# Patient Record
Sex: Female | Born: 1970 | Race: Black or African American | Hispanic: No | Marital: Married | Smoking: Current every day smoker
Health system: Southern US, Community
[De-identification: ages and names within clinical notes are randomized; demographics above are authoritative.]

## PROBLEM LIST (undated history)

## (undated) HISTORY — PX: HERNIA REPAIR: SHX51

---

## 1999-03-23 ENCOUNTER — Emergency Department (HOSPITAL_COMMUNITY): Admission: EM | Admit: 1999-03-23 | Discharge: 1999-03-23 | Payer: Self-pay | Admitting: Emergency Medicine

## 2001-08-03 ENCOUNTER — Encounter: Payer: Self-pay | Admitting: *Deleted

## 2001-08-03 ENCOUNTER — Emergency Department (HOSPITAL_COMMUNITY): Admission: EM | Admit: 2001-08-03 | Discharge: 2001-08-03 | Payer: Self-pay | Admitting: *Deleted

## 2002-04-06 ENCOUNTER — Emergency Department (HOSPITAL_COMMUNITY): Admission: EM | Admit: 2002-04-06 | Discharge: 2002-04-06 | Payer: Self-pay

## 2003-09-29 ENCOUNTER — Encounter: Payer: Self-pay | Admitting: Emergency Medicine

## 2003-09-29 ENCOUNTER — Emergency Department (HOSPITAL_COMMUNITY): Admission: EM | Admit: 2003-09-29 | Discharge: 2003-09-29 | Payer: Self-pay | Admitting: Emergency Medicine

## 2004-03-20 ENCOUNTER — Emergency Department (HOSPITAL_COMMUNITY): Admission: EM | Admit: 2004-03-20 | Discharge: 2004-03-20 | Payer: Self-pay

## 2005-05-07 ENCOUNTER — Encounter: Payer: Self-pay | Admitting: Emergency Medicine

## 2005-05-08 ENCOUNTER — Inpatient Hospital Stay (HOSPITAL_COMMUNITY): Admission: EM | Admit: 2005-05-08 | Discharge: 2005-05-10 | Payer: Self-pay | Admitting: Neurology

## 2011-09-22 ENCOUNTER — Inpatient Hospital Stay (HOSPITAL_COMMUNITY)
Admission: EM | Admit: 2011-09-22 | Discharge: 2011-09-29 | DRG: 392 | Disposition: A | Discharge status: Home / Self Care | Payer: Self-pay | Attending: Internal Medicine | Admitting: Internal Medicine

## 2011-09-22 ENCOUNTER — Emergency Department (HOSPITAL_COMMUNITY): Payer: Self-pay

## 2011-09-22 DIAGNOSIS — D649 Anemia, unspecified: Secondary | ICD-10-CM | POA: Diagnosis present

## 2011-09-22 DIAGNOSIS — R197 Diarrhea, unspecified: Secondary | ICD-10-CM | POA: Diagnosis present

## 2011-09-22 DIAGNOSIS — B9689 Other specified bacterial agents as the cause of diseases classified elsewhere: Secondary | ICD-10-CM | POA: Diagnosis present

## 2011-09-22 DIAGNOSIS — A499 Bacterial infection, unspecified: Secondary | ICD-10-CM | POA: Diagnosis present

## 2011-09-22 DIAGNOSIS — A088 Other specified intestinal infections: Principal | ICD-10-CM | POA: Diagnosis present

## 2011-09-22 DIAGNOSIS — N760 Acute vaginitis: Secondary | ICD-10-CM | POA: Diagnosis present

## 2011-09-22 DIAGNOSIS — D259 Leiomyoma of uterus, unspecified: Secondary | ICD-10-CM | POA: Diagnosis present

## 2011-09-22 DIAGNOSIS — R1115 Cyclical vomiting syndrome unrelated to migraine: Secondary | ICD-10-CM | POA: Diagnosis present

## 2011-09-22 DIAGNOSIS — F17200 Nicotine dependence, unspecified, uncomplicated: Secondary | ICD-10-CM | POA: Diagnosis present

## 2011-09-22 DIAGNOSIS — K5730 Diverticulosis of large intestine without perforation or abscess without bleeding: Secondary | ICD-10-CM | POA: Diagnosis present

## 2011-09-22 LAB — OCCULT BLOOD X 1 CARD TO LAB, STOOL: Fecal Occult Bld: NEGATIVE

## 2011-09-22 LAB — URINALYSIS, ROUTINE W REFLEX MICROSCOPIC
Bilirubin Urine: NEGATIVE
Hgb urine dipstick: NEGATIVE
Leukocytes, UA: NEGATIVE

## 2011-09-22 LAB — COMPREHENSIVE METABOLIC PANEL
AST: 18 U/L (ref 0–37)
Albumin: 4 g/dL (ref 3.5–5.2)
BUN: 6 mg/dL (ref 6–23)
CO2: 22 mEq/L (ref 19–32)
Calcium: 9.8 mg/dL (ref 8.4–10.5)
Chloride: 101 mEq/L (ref 96–112)
GFR calc Af Amer: >90 mL/min (ref >90)
Glucose, Bld: 95 mg/dL (ref 70–99)
Potassium: 3.6 mEq/L (ref 3.5–5.1)
Total Bilirubin: 0.4 mg/dL (ref 0.3–1.2)
Total Protein: 7.8 g/dL (ref 6.0–8.3)

## 2011-09-22 LAB — DIFFERENTIAL
Basophils Relative: 0 % (ref 0–1)
Eosinophils Absolute: 0.1 10*3/uL (ref 0.0–0.7)
Eosinophils Relative: 2 % (ref 0–5)
Lymphs Abs: 1.8 10*3/uL (ref 0.7–4.0)
Monocytes Absolute: 0.5 10*3/uL (ref 0.1–1.0)

## 2011-09-22 LAB — WET PREP, GENITAL

## 2011-09-22 LAB — CBC
HCT: 34.7 % — ABNORMAL LOW (ref 36.0–46.0)
Hemoglobin: 11.7 g/dL — ABNORMAL LOW (ref 12.0–15.0)
MCHC: 33.7 g/dL (ref 30.0–36.0)
MCV: 87.2 fL (ref 78.0–100.0)

## 2011-09-22 LAB — BASIC METABOLIC PANEL
BUN: 5 mg/dL — ABNORMAL LOW (ref 6–23)
CO2: 24 mEq/L (ref 19–32)
Calcium: 8.7 mg/dL (ref 8.4–10.5)
Chloride: 105 mEq/L (ref 96–112)
Creatinine, Ser: 0.72 mg/dL (ref 0.50–1.10)
Glucose, Bld: 94 mg/dL (ref 70–99)
Sodium: 137 mEq/L (ref 135–145)

## 2011-09-22 LAB — PREGNANCY, URINE: Preg Test, Ur: NEGATIVE

## 2011-09-22 MED ORDER — IOHEXOL 300 MG/ML  SOLN
100.0000 mL | Freq: Once | INTRAMUSCULAR | Status: AC | PRN
  Administered 2011-09-22: 100 mL via INTRAVENOUS

## 2011-09-23 DIAGNOSIS — R112 Nausea with vomiting, unspecified: Secondary | ICD-10-CM

## 2011-09-23 DIAGNOSIS — R1084 Generalized abdominal pain: Secondary | ICD-10-CM

## 2011-09-23 DIAGNOSIS — R933 Abnormal findings on diagnostic imaging of other parts of digestive tract: Secondary | ICD-10-CM

## 2011-09-23 NOTE — H&P (Signed)
  [name], Leah Lane NO.:  [id number]  MEDICAL RECORD NO.:  [id number]  LOCATION:  1533                         FACILITY:  [facility]  PHYSICIAN:  Tennis Mckinnon, DO         DATE OF BIRTH:  March 25, 1971  DATE OF ADMISSION:  09/22/2011 DATE OF DISCHARGE:                             HISTORY & PHYSICAL   ADDENDUM:  Home medication list is made available.  The patient takes ibuprofen 200 mg 2 p.o. p.r.n. pain.  ALLERGIES:  NKDA.          ______________________________ [name], DO     AS/MEDQ  D:  09/22/2011  T:  09/22/2011  Job:  [id number]  Electronically Signed by [name] DO on 09/23/2011 03:49:45 PM

## 2011-09-23 NOTE — H&P (Signed)
Leah Lane, KRIEGER                    ACCOUNT NO.:  [id number]  MEDICAL RECORD NO.:  [id number]  LOCATION:  1533                         FACILITY:  [facility]  PHYSICIAN:  Barron Vanloan, DO         DATE OF BIRTH:  Feb 09, 1971  DATE OF ADMISSION:  09/22/2011 DATE OF DISCHARGE:                             HISTORY & PHYSICAL   CHIEF COMPLAINT:  Abdominal pain, nausea, vomiting, diarrhea.  HISTORY OF PRESENT ILLNESS:  The patient is a 40 year old female who denies any previous medical history, but also states that she has not seen a doctor in a few years.  The patient states that about a week ago on Sunday, she started having severe abdominal cramping and what she calls diarrhea.  She states she would have a stool and it would be loose, but it would not be a lot.  This continues for the next 2-3 days. It is pretty much stopped now, but she has continued to have severe abdominal cramping, nausea, vomiting.  She states she is unable to keep anything down.  She states her stools have been black, although she is Hemoccult negative from below.  No fevers.  No chills.  Positive for weakness.  PAST MEDICAL HISTORY:  Negative.  PAST SURGICAL HISTORY:  Negative.  SOCIAL HISTORY:  The patient smokes less than a pack per day.  She states she has roughly a 40-pack-year smoking history.  She drinks daily.  She denies recreational drug use.  FAMILY HISTORY:  Positive only for hypertension.  REVIEW OF SYSTEMS:  CONSTITUTIONAL:  Negative for fever.  Negative for chills.  Positive for weakness.  Positive for malaise.  CNS:  No headaches or seizures.  No limb weakness.  ENT:  No nasal congestion. No throat pain.  No coryza.  CARDIOVASCULAR:  No chest pain.  No palpitations.  No orthopnea.  RESPIRATORY:  No cough.  No shortness of breath.  No wheezing.  GASTROINTESTINAL:  No nausea, no vomiting.  No constipation.  No diarrhea.  GENITOURINARY:  No dysuria.  No hematuria. No urinary frequency.  RENAL:  No  flank pain.  No swelling.  No pruritus.  SKIN:  No rashes or sores or lesions.  HEMATOLOGICAL:  No easy bruising.  No purpura.  No clots.  LYMPH:  No lymphadenopathy.  No painful lymph nodes or specific lymph swelling.  PSYCHIATRIC:  No anxiety.  No depression.  No insomnia.  PHYSICAL EXAMINATION:  VITAL SIGNS:  Temperature 98.9, pulse 83, respiratory rate 18, blood pressure 143/86, O2 sat 100% on room air. GENERAL:  The patient is awake, alert, and oriented x3.  She is able to give fair history. HEENT:  Eyes, pupils equal, round, and reactive to light and accommodation.  External ocular movements bilaterally intact.  Sclerae nonicteric, noninjected.  Mouth, oral mucosa is moist.  No lesions.  No sores.  Pharynx clean, no erythema, no exudate. NECK:  Negative for JVD.  Negative for thyromegaly.  Negative for lymphadenopathy. HEART:  Regular rate and rhythm at 80 beats per minute without murmur, ectopy, or gallops.  No lateral PMI.  No thrills. LUNGS:  Clear to auscultation bilaterally without wheezes, rales,  or rhonchi.  No increased work of breathing.  No tactile fremitus. ABDOMEN:  Distended, diffusely tender.  Positive for bowel sounds.  No hepatosplenomegaly.  No hernias palpated. EXTREMITIES:  Negative for cyanosis, clubbing, or edema.  The patient has positive dorsalis pedis and popliteal pulses bilaterally.  No carotid bruits bilaterally. NEUROLOGIC:  Cranial nerves II through XII gross intact.  Motor and sensory intact.  LABORATORY DATA:  WBC is 5.4, hemoglobin 11.7, hematocrit 34.7, platelets 544.  Sodium 135, potassium 3.6, chloride 101, CO2 22, BUN 6, creatinine 0.76, glucose 95, T bili 0.4, alk phos 65, AST 18, ALT 5, calcium 9.8, albumin 4.0, total protein 7.8, lipase 29.  Urinalysis is negative.  A wet mount was performed and the patient was positive for clue cells.  CT of the abdomen and pelvis demonstrates colitis and a fibroid uterus.  ASSESSMENT: 1. Colitis with  inflammation of the entire colon down to the rectum. 2. Abdominal pain. 3. Nausea and vomiting, which is intractable along with intractable     abdominal pain. 4. Alcohol abuse.  The patient drinks daily.  She states it is not     always a lot.  I question this to some degree. 5. Bacterial vaginosis. 6. Tobacco abuse.  PLAN: 1. Admit. 2. Pain control. 3. IV antibiotics. 4. Stool studies. 5. CIWA protocol. 6. Nicotine patch. 7. IV antibiotics will include metronidazole for treatment of her     bacterial vaginosis and the patient also received tobacco cessation     counseling.  Spent 30 minutes on this admission.          ______________________________ Leah Lowes, DO     AS/MEDQ  D:  09/22/2011  T:  09/22/2011  Job:  [id number]  Electronically Signed by Leah Lowes DO on 09/23/2011 03:49:41 PM

## 2011-09-24 DIAGNOSIS — R112 Nausea with vomiting, unspecified: Secondary | ICD-10-CM

## 2011-09-24 DIAGNOSIS — R1084 Generalized abdominal pain: Secondary | ICD-10-CM

## 2011-09-24 DIAGNOSIS — R933 Abnormal findings on diagnostic imaging of other parts of digestive tract: Secondary | ICD-10-CM

## 2011-09-24 LAB — COMPREHENSIVE METABOLIC PANEL
ALT: 10 U/L (ref 0–35)
AST: 17 U/L (ref 0–37)
CO2: 25 mEq/L (ref 19–32)
Calcium: 8.6 mg/dL (ref 8.4–10.5)
Creatinine, Ser: 0.68 mg/dL (ref 0.50–1.10)
Potassium: 3.6 mEq/L (ref 3.5–5.1)
Total Bilirubin: 0.2 mg/dL — ABNORMAL LOW (ref 0.3–1.2)

## 2011-09-24 LAB — OVA AND PARASITE EXAMINATION

## 2011-09-24 LAB — GC/CHLAMYDIA PROBE AMP, GENITAL
Chlamydia, DNA Probe: UNDETERMINED
GC Probe Amp, Genital: NEGATIVE

## 2011-09-24 LAB — CBC
Hemoglobin: 9.5 g/dL — ABNORMAL LOW (ref 12.0–15.0)
MCH: 29.7 pg (ref 26.0–34.0)
MCHC: 34.1 g/dL (ref 30.0–36.0)
MCV: 87.2 fL (ref 78.0–100.0)
Platelets: 377 10*3/uL (ref 150–400)
RBC: 3.2 MIL/uL — ABNORMAL LOW (ref 3.87–5.11)

## 2011-09-24 LAB — FECAL LACTOFERRIN, QUANT

## 2011-09-24 LAB — C-REACTIVE PROTEIN: CRP: 0.03 mg/dL — ABNORMAL LOW (ref <0.60)

## 2011-09-25 DIAGNOSIS — R112 Nausea with vomiting, unspecified: Secondary | ICD-10-CM

## 2011-09-25 DIAGNOSIS — R1084 Generalized abdominal pain: Secondary | ICD-10-CM

## 2011-09-25 DIAGNOSIS — R933 Abnormal findings on diagnostic imaging of other parts of digestive tract: Secondary | ICD-10-CM

## 2011-09-25 LAB — OVA AND PARASITE EXAMINATION

## 2011-09-25 LAB — FECAL LACTOFERRIN, QUANT

## 2011-09-26 ENCOUNTER — Inpatient Hospital Stay (HOSPITAL_COMMUNITY): Payer: Self-pay

## 2011-09-26 LAB — COMPREHENSIVE METABOLIC PANEL
ALT: 19 U/L (ref 0–35)
Albumin: 2.8 g/dL — ABNORMAL LOW (ref 3.5–5.2)
Alkaline Phosphatase: 51 U/L (ref 39–117)
Calcium: 9.3 mg/dL (ref 8.4–10.5)
GFR calc Af Amer: >90 mL/min (ref >90)
Glucose, Bld: 104 mg/dL — ABNORMAL HIGH (ref 70–99)
Potassium: 3.9 mEq/L (ref 3.5–5.1)
Sodium: 136 mEq/L (ref 135–145)
Total Protein: 5.9 g/dL — ABNORMAL LOW (ref 6.0–8.3)

## 2011-09-26 LAB — CBC
MCH: 29.3 pg (ref 26.0–34.0)
MCHC: 33.2 g/dL (ref 30.0–36.0)
Platelets: 402 10*3/uL — ABNORMAL HIGH (ref 150–400)
RDW: 15.3 % (ref 11.5–15.5)

## 2011-09-27 ENCOUNTER — Other Ambulatory Visit: Payer: Self-pay | Admitting: Internal Medicine

## 2011-09-27 LAB — STOOL CULTURE

## 2011-09-28 LAB — CBC
HCT: 29.2 % — ABNORMAL LOW (ref 36.0–46.0)
Hemoglobin: 9.6 g/dL — ABNORMAL LOW (ref 12.0–15.0)
MCHC: 32.9 g/dL (ref 30.0–36.0)
MCV: 88 fL (ref 78.0–100.0)
RDW: 15.7 % — ABNORMAL HIGH (ref 11.5–15.5)

## 2011-09-28 LAB — STOOL CULTURE

## 2011-09-28 LAB — BASIC METABOLIC PANEL
BUN: <3 mg/dL — ABNORMAL LOW (ref 6–23)
Chloride: 103 mEq/L (ref 96–112)
Creatinine, Ser: 0.85 mg/dL (ref 0.50–1.10)
GFR calc non Af Amer: 85 mL/min — ABNORMAL LOW (ref >90)
Glucose, Bld: 102 mg/dL — ABNORMAL HIGH (ref 70–99)
Potassium: 4 mEq/L (ref 3.5–5.1)

## 2011-09-30 ENCOUNTER — Encounter: Payer: Self-pay | Admitting: Internal Medicine

## 2011-10-05 NOTE — Discharge Summary (Signed)
  NAMELEILENE, DIPRIMA NO.:  [id number]  MEDICAL RECORD NO.:  [id number]  LOCATION:  1533                         FACILITY:  [facility]  PHYSICIAN:  Rima Blizzard I Emmamarie Kluender, MD      DATE OF BIRTH:  [birth year]  DATE OF ADMISSION:  09/22/2011 DATE OF DISCHARGE:  09/29/2011                              DISCHARGE SUMMARY   DISCHARGE DIAGNOSES: 1. Mild abdominal colitis. 2. Abdominal pain, felt to be secondary to colitis versus fibroid     uterus. 3. Anemia, needs to be followed and monitored as outpatient.  MEDICATIONS: 1. Cipro 500 mg p.o. b.i.d. for 4 days. 2. Flagyl 500 mg p.o. q.8 hours. 3. Vicodin 5 mg 1 tablet daily.  PROCEDURES: 1. Colonoscopy:  Nonspecific finding to explain abdominal pain.     Patient has mild erythema on the transverse, descending, and     sigmoid colon alone.  Status post biopsy, diverticula in the     sigmoid colon without diverticulitis, and a small internal     hemorrhoid. 2. CT of abdomen and pelvis did show positive for colitis, likely     inflammatory, infectious, ischemic colitis cannot be excluded. 3. Fibroid uterus.  HISTORY OF PRESENT ILLNESS:  This is a 40 year old female, denies any past medical history presented with abdominal pain, nausea, vomiting, and diarrhea.  On examination, abdomen seems soft, but the patient admitted diffusely tender.  No hepatosplenomegaly, no masses.  On evaluation in the ED, patient has white blood cells 5.4, hemoglobin of 11.7, hematocrit 34.7, platelets 544.  Sodium 135, potassium 3.6.  A CT scan suggests colitis with some fibroid uterus.  Colitis.  Patient treated with Cipro and Flagyl, also secondary to patient's persistent abdominal pain despite soft and smooth abdomen. Patient continued to complain of abdominal pain.  GI underwent a sigmoidoscopy/colonoscopy, which did show mild erythema and that cannot explain the severity of patient's pain.  Question about the patient's cause of pain could be  her fibroid or narcotic seeking.  Patient's medication tapered slowly and the patient tolerated diet.  Patient will be discharged with Cipro and Flagyl.  The patient was advised to follow up with her MD as outpatient.  We will dispense 20 tabs of Vicodin.  Her abdominal pain could be related to uterine fibroid, but the impression about the patient's colitis was not very impressive about that is the cause of her abdominal pain.  Has no white blood cells, has no fever.  Abdomen seems soft nontender and bowel sounds positive.  Currently, I felt the patient is medically stable for discharge, need to follow up with her MD as outpatient.  Also, patient need to counsel regarding smoking and alcohol abuse.     Arnol Mcgibbon Bosie Helper, MD     HIE/MEDQ  D:  09/29/2011  T:  09/30/2011  Job:  [id number]  Electronically Signed by Ebony Cargo MD on 10/05/2011 12:25:52 PM

## 2012-04-25 ENCOUNTER — Emergency Department (HOSPITAL_COMMUNITY)
Admission: EM | Admit: 2012-04-25 | Discharge: 2012-04-25 | Disposition: A | Discharge status: Home / Self Care | Payer: Self-pay | Attending: Emergency Medicine | Admitting: Emergency Medicine

## 2012-04-25 ENCOUNTER — Encounter (HOSPITAL_COMMUNITY): Payer: Self-pay | Admitting: *Deleted

## 2012-04-25 DIAGNOSIS — R112 Nausea with vomiting, unspecified: Secondary | ICD-10-CM | POA: Insufficient documentation

## 2012-04-25 DIAGNOSIS — K529 Noninfective gastroenteritis and colitis, unspecified: Secondary | ICD-10-CM

## 2012-04-25 DIAGNOSIS — K5289 Other specified noninfective gastroenteritis and colitis: Secondary | ICD-10-CM | POA: Insufficient documentation

## 2012-04-25 LAB — COMPREHENSIVE METABOLIC PANEL
ALT: 14 U/L (ref 0–35)
BUN: 10 mg/dL (ref 6–23)
CO2: 22 mEq/L (ref 19–32)
Calcium: 9.2 mg/dL (ref 8.4–10.5)
Creatinine, Ser: 0.9 mg/dL (ref 0.50–1.10)
GFR calc Af Amer: >90 mL/min (ref >90)
GFR calc non Af Amer: 79 mL/min — ABNORMAL LOW (ref >90)
Glucose, Bld: 96 mg/dL (ref 70–99)
Sodium: 136 mEq/L (ref 135–145)

## 2012-04-25 LAB — CBC
HCT: 30.8 % — ABNORMAL LOW (ref 36.0–46.0)
Hemoglobin: 9.7 g/dL — ABNORMAL LOW (ref 12.0–15.0)
MCH: 26.1 pg (ref 26.0–34.0)
MCV: 82.8 fL (ref 78.0–100.0)
RBC: 3.72 MIL/uL — ABNORMAL LOW (ref 3.87–5.11)
WBC: 8.1 10*3/uL (ref 4.0–10.5)

## 2012-04-25 LAB — DIFFERENTIAL
Eosinophils Relative: 4 % (ref 0–5)
Lymphocytes Relative: 21 % (ref 12–46)
Lymphs Abs: 1.7 10*3/uL (ref 0.7–4.0)
Monocytes Absolute: 0.8 10*3/uL (ref 0.1–1.0)
Monocytes Relative: 10 % (ref 3–12)

## 2012-04-25 LAB — URINALYSIS, ROUTINE W REFLEX MICROSCOPIC
Bilirubin Urine: NEGATIVE
Hgb urine dipstick: NEGATIVE
Ketones, ur: NEGATIVE mg/dL
Specific Gravity, Urine: 1.028 (ref 1.005–1.030)
pH: 6 (ref 5.0–8.0)

## 2012-04-25 LAB — LIPASE, BLOOD: Lipase: 45 U/L (ref 11–59)

## 2012-04-25 MED ORDER — CIPROFLOXACIN HCL 500 MG PO TABS: 500.0000 mg | ORAL_TABLET | Freq: Two times a day (BID) | ORAL | Status: AC

## 2012-04-25 MED ORDER — SODIUM CHLORIDE 0.9 % IV BOLUS (SEPSIS)
1000.0000 mL | Freq: Once | INTRAVENOUS | Status: AC
  Administered 2012-04-25: 1000 mL via INTRAVENOUS

## 2012-04-25 MED ORDER — MORPHINE SULFATE 4 MG/ML IJ SOLN
4.0000 mg | Freq: Once | INTRAMUSCULAR | Status: AC
  Administered 2012-04-25: 4 mg via INTRAVENOUS
  Filled 2012-04-25: qty 1

## 2012-04-25 MED ORDER — HYDROCODONE-ACETAMINOPHEN 5-325 MG PO TABS: 2.0000 | ORAL_TABLET | ORAL | Status: AC | PRN

## 2012-04-25 MED ORDER — METRONIDAZOLE 500 MG PO TABS: 500.0000 mg | ORAL_TABLET | Freq: Two times a day (BID) | ORAL | Status: AC

## 2012-04-25 MED ORDER — ONDANSETRON HCL 4 MG/2ML IJ SOLN
4.0000 mg | Freq: Once | INTRAMUSCULAR | Status: AC
Start: 2012-04-25 — End: 2012-04-25
  Administered 2012-04-25: 4 mg via INTRAVENOUS
  Filled 2012-04-25: qty 2

## 2012-04-25 NOTE — ED Provider Notes (Signed)
Medical screening examination/treatment/procedure(s) were performed by non-physician practitioner and as supervising physician I was immediately available for consultation/collaboration.   Hanley Seamen, MD 04/25/12 [phone]

## 2012-04-25 NOTE — ED Notes (Signed)
Pt c/o n/v/d and abdominal cramping all day. Pt has hx of "colon infection" and spent time in hospital w/i past year

## 2012-04-25 NOTE — ED Notes (Signed)
MD/PA at bedside. 

## 2012-04-25 NOTE — ED Provider Notes (Signed)
History     CSN: [id number]  Arrival date & time 04/25/12  0000   First MD Initiated Contact with Patient 04/25/12 0240      Chief Complaint  Patient presents with  . Abdominal Cramping  . Nausea  . Emesis  . Diarrhea    HPI  History provided by the patient. Patient is a 41 year old female with no significant past medical history who presents with complaints of abdominal pains and cramping with episodes of nausea vomiting and diarrhea. Patient states the symptoms feel similar to previous abdominal pain symptoms with nausea and vomiting last October. Patient states she was evaluated for this and stayed in the hospital but does not remember when she was diagnosed with. Patient does use alcohol daily and this has not changed. She denies any aggravating or alleviating factors. Pain is described as severe. It is located in all parts of the abdomen at different times. Patient denies having any fever, chills, sweats. Patient denies any night sweats or weight loss.    History reviewed. No pertinent past medical history.  History reviewed. No pertinent past surgical history.  History reviewed. No pertinent family history.  History  Substance Use Topics  . Smoking status: Current Everyday Smoker    Types: Cigarettes  . Smokeless tobacco: Not on file  . Alcohol Use: Yes    OB History    Grav Para Term Preterm Abortions TAB SAB Ect Mult Living                  Review of Systems  Constitutional: Positive for appetite change. Negative for fever and chills.  Respiratory: Negative for cough and shortness of breath.   Cardiovascular: Negative for chest pain.  Gastrointestinal: Positive for nausea, vomiting, abdominal pain and diarrhea.  Genitourinary: Negative for dysuria, frequency, hematuria, flank pain, vaginal bleeding and vaginal discharge.    Allergies  Review of patient's allergies indicates no known allergies.  Home Medications  No current outpatient prescriptions on  file.  BP 114/78  Pulse 85  Temp(Src) 98.3 F (36.8 C) (Oral)  Resp 20  SpO2 100%  LMP 04/15/2012  Physical Exam  Nursing note and vitals reviewed. Constitutional: She is oriented to person, place, and time. She appears well-developed and well-nourished. No distress.  HENT:  Head: Normocephalic and atraumatic.  Mouth/Throat: Oropharynx is clear and moist.  Cardiovascular: Normal rate and regular rhythm.   Pulmonary/Chest: Effort normal and breath sounds normal. No respiratory distress. She has no wheezes. She has no rales.  Abdominal: Soft. She exhibits no distension. There is tenderness. There is no rebound and no guarding.        Diffuse abdominal tenderness appears to be greatest in left upper quadrant. Abdomen is soft. No peritoneal signs. Bowel sounds slightly increased.  Neurological: She is alert and oriented to person, place, and time.  Skin: Skin is warm and dry. No rash noted.  Psychiatric: She has a normal mood and affect. Her behavior is normal.    ED Course  Procedures   Results for orders placed during the hospital encounter of 04/25/12  URINALYSIS, ROUTINE W REFLEX MICROSCOPIC      Component Value Range   Color, Urine YELLOW  YELLOW    APPearance CLOUDY (*) CLEAR    Specific Gravity, Urine 1.028  1.005 - 1.030    pH 6.0  5.0 - 8.0    Glucose, UA NEGATIVE  NEGATIVE (mg/dL)   Hgb urine dipstick NEGATIVE  NEGATIVE    Bilirubin Urine NEGATIVE  NEGATIVE    Ketones, ur NEGATIVE  NEGATIVE (mg/dL)   Protein, ur NEGATIVE  NEGATIVE (mg/dL)   Urobilinogen, UA 0.2  0.0 - 1.0 (mg/dL)   Nitrite NEGATIVE  NEGATIVE    Leukocytes, UA NEGATIVE  NEGATIVE   POCT PREGNANCY, URINE      Component Value Range   Preg Test, Ur NEGATIVE  NEGATIVE   CBC      Component Value Range   WBC 8.1  4.0 - 10.5 (K/uL)   RBC 3.72 (*) 3.87 - 5.11 (MIL/uL)   Hemoglobin 9.7 (*) 12.0 - 15.0 (g/dL)   HCT 45.4 (*) 09.8 - 46.0 (%)   MCV 82.8  78.0 - 100.0 (fL)   MCH 26.1  26.0 - 34.0 (pg)    MCHC 31.5  30.0 - 36.0 (g/dL)   RDW 11.9 (*) 14.7 - 15.5 (%)   Platelets 597 (*) 150 - 400 (K/uL)  DIFFERENTIAL      Component Value Range   Neutrophils Relative 64  43 - 77 (%)   Neutro Abs 5.2  1.7 - 7.7 (K/uL)   Lymphocytes Relative 21  12 - 46 (%)   Lymphs Abs 1.7  0.7 - 4.0 (K/uL)   Monocytes Relative 10  3 - 12 (%)   Monocytes Absolute 0.8  0.1 - 1.0 (K/uL)   Eosinophils Relative 4  0 - 5 (%)   Eosinophils Absolute 0.4  0.0 - 0.7 (K/uL)   Basophils Relative 0  0 - 1 (%)   Basophils Absolute 0.0  0.0 - 0.1 (K/uL)  COMPREHENSIVE METABOLIC PANEL      Component Value Range   Sodium 136  135 - 145 (mEq/L)   Potassium 3.7  3.5 - 5.1 (mEq/L)   Chloride 103  96 - 112 (mEq/L)   CO2 22  19 - 32 (mEq/L)   Glucose, Bld 96  70 - 99 (mg/dL)   BUN 10  6 - 23 (mg/dL)   Creatinine, Ser 8.29  0.50 - 1.10 (mg/dL)   Calcium 9.2  8.4 - 56.2 (mg/dL)   Total Protein 7.3  6.0 - 8.3 (g/dL)   Albumin 3.4 (*) 3.5 - 5.2 (g/dL)   AST 26  0 - 37 (U/L)   ALT 14  0 - 35 (U/L)   Alkaline Phosphatase 89  39 - 117 (U/L)   Total Bilirubin 0.3  0.3 - 1.2 (mg/dL)   GFR calc non Af Amer 79 (*) >90 (mL/min)   GFR calc Af Amer >90  >90 (mL/min)  LIPASE, BLOOD      Component Value Range   Lipase 45  11 - 59 (U/L)        1. Abdominal pain   2. Nausea vomiting and diarrhea   3. Colitis       MDM  2:30AM Patient seen and evaluated. Patient no acute distress.  Patient reports feeling much better after IV fluids and pain medications. Patient has unremarkable lab tests at this time. Abdomen is still soft patient reports diffuse pains. She continues to state that symptoms are similar to episode this past October. Patient was given prescriptions for Flagyl Cipro that time.  Patient does feel ready to return home at this time. Plan to give prescriptions for Flagyl Cipro to cover possible colitis. Patient given referral for GI and instructed to followup with PCP.      Angus Seller, [place] 04/25/12  [phone]

## 2012-04-25 NOTE — Discharge Instructions (Signed)
You were seen and evaluated for your complaints of abdominal pain, nausea, vomiting and diarrhea. Your lab tests today have not shown any concerning signs to explain your condition. At this time your providers feel you may return home and followup with a primary care provider or GI specialist for continued evaluation and treatment. You have been given prescriptions for antibiotics to use for possible colitis infection. Please take these as prescribed for the full length of time. Return to emergency room for any worsening symptoms.   Abdominal Pain Abdominal pain can be caused by many things. Your caregiver decides the seriousness of your pain by an examination and possibly blood tests and X-rays. Many cases can be observed and treated at home. Most abdominal pain is not caused by a disease and will probably improve without treatment. However, in many cases, more time must pass before a clear cause of the pain can be found. Before that point, it may not be known if you need more testing, or if hospitalization or surgery is needed. HOME CARE INSTRUCTIONS   Do not take laxatives unless directed by your caregiver.   Take pain medicine only as directed by your caregiver.   Only take over-the-counter or prescription medicines for pain, discomfort, or fever as directed by your caregiver.   Try a clear liquid diet (broth, tea, or water) for as long as directed by your caregiver. Slowly move to a bland diet as tolerated.  SEEK IMMEDIATE MEDICAL CARE IF:   The pain does not go away.   You have a fever.   You keep throwing up (vomiting).   The pain is felt only in portions of the abdomen. Pain in the right side could possibly be appendicitis. In an adult, pain in the left lower portion of the abdomen could be colitis or diverticulitis.   You pass bloody or black tarry stools.  MAKE SURE YOU:   Understand these instructions.   Will watch your condition.   Will get help right away if you are not  doing well or get worse.  Document Released: 09/11/2005 Document Revised: 11/21/2011 Document Reviewed: 07/20/2008 [facility] Patient Information 2012 [place], [place].    Colitis Colitis is inflammation of the colon. Colitis can be a short-term or long-standing (chronic) illness. Crohn's disease and ulcerative colitis are 2 types of colitis which are chronic. They usually require lifelong treatment. CAUSES  There are many different causes of colitis, including:  Viruses.   Germs (bacteria).   Medicine reactions.  SYMPTOMS   Diarrhea.   Intestinal bleeding.   Pain.   Fever.   Throwing up (vomiting).   Tiredness (fatigue).   Weight loss.   Bowel blockage.  DIAGNOSIS  The diagnosis of colitis is based on examination and stool or blood tests. X-rays, CT scan, and colonoscopy may also be needed. TREATMENT  Treatment may include:  Fluids given through the vein (intravenously).   Bowel rest (nothing to eat or drink for a period of time).   Medicine for pain and diarrhea.   Medicines (antibiotics) that kill germs.   Cortisone medicines.   Surgery.  HOME CARE INSTRUCTIONS   Get plenty of rest.   Drink enough water and fluids to keep your urine clear or pale yellow.   Eat a well-balanced diet.   Call your caregiver for follow-up as recommended.  SEEK IMMEDIATE MEDICAL CARE IF:   You develop chills.   You have an oral temperature above 102 F (38.9 C), not controlled by medicine.   You  have extreme weakness, fainting, or dehydration.   You have repeated vomiting.   You develop severe belly (abdominal) pain or are passing bloody or tarry stools.  MAKE SURE YOU:   Understand these instructions.   Will watch your condition.   Will get help right away if you are not doing well or get worse.  Document Released: 01/09/2005 Document Revised: 11/21/2011 Document Reviewed: 04/06/2010 [facility] Patient Information 2012 [place], [place].   RESOURCE  GUIDE  Dental Problems  Patients with Medicaid: [facility] [phone] [address]                                           [phone] [address] Phone:  [phone]                                                  Phone:  [phone]  If unable to pay or uninsured, contact:  [facility] or [facility]. to become qualified for the adult dental clinic.  Chronic Pain Problems Contact [facility]  [phone] Patients need to be referred by their primary care doctor.  Insufficient Money for Medicine Contact [employer]:  call "211" or [facility] [phone]) 816-6931.  No Primary Care Doctor Call [facility]  [phone] Other agencies that provide inexpensive medical care    [facility]  [phone]    [facility]  [phone]    [facility]  [phone]    [facility]  [phone]    [facility]  [phone]    [facility]  [phone]  Psychological Services [facility]  [phone] [facility] Services  [phone] [facility]   [phone] (emergency services [phone])  Substance Abuse Resources [facility]  [phone] [facility] [phone] [facility] [place] [phone] Floydene Flock [phone] Residential & Outpatient Substance Abuse Program  507-239-2110  Abuse/Neglect [facility] Child Abuse Hotline 717-405-6855 [facility] Child Abuse Hotline 717-462-6918 (After Hours)  Emergency Shelter [facility] 5482655348  M[facility] Homes [facility] 807-617-0083 [facility] [phone]  MRSA Hotline #:   5198653178    [facility] Resources  [facility]     [employer]                          [facility] Dept. [address] [place]                       [address]       [address]  Patrecia Pace  [facility] Phone:  [phone]                                   Phone:  [phone]                 Phone:  [place] Phone:  [place] [phone] (After Hours)

## 2012-11-02 IMAGING — CT CT ABD-PELV W/ CM
1 of 2 series · 15 of 32 positions shown, 19 images · IV contrast (100 ML OMNI 300)
Comparison: None

CLINICAL DATA: lower abdominal pain and diarrhea

CT ABDOMEN AND PELVIS WITH CONTRAST
TECHNIQUE: Multidetector CT imaging of the abdomen and pelvis was
performed following the standard protocol during bolus
administration of intravenous contrast.
Contrast: 100mL OMNIPAQUE IOHEXOL 300 MG/ML IV SOLN

[Series 2: abd/pel with · axial · 0.63mm/px · z∈[+812,+1197]mm · 15 of 85 slices shown, 19 images]
[im 4/85  soft-tissue]
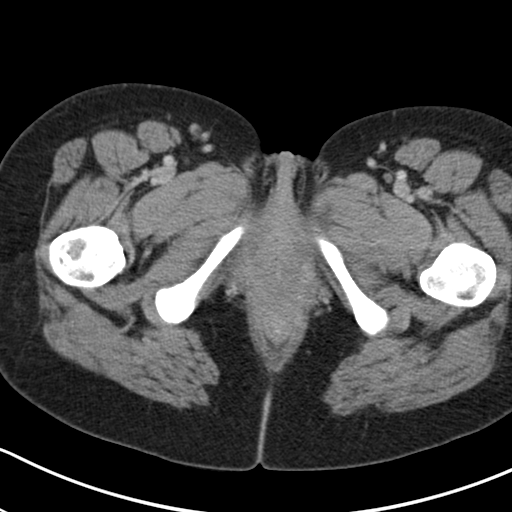
[im 4/85  bone]
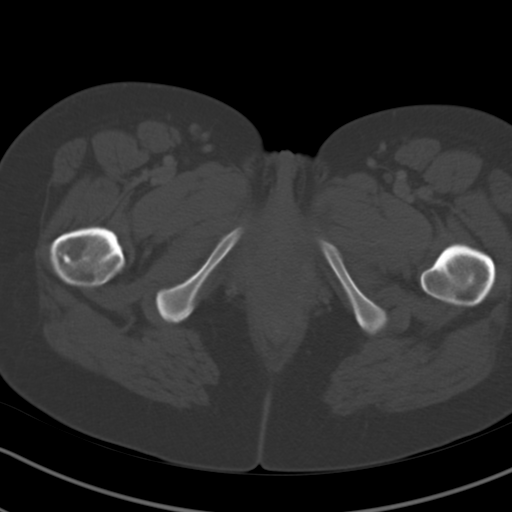
[im 10/85  soft-tissue]
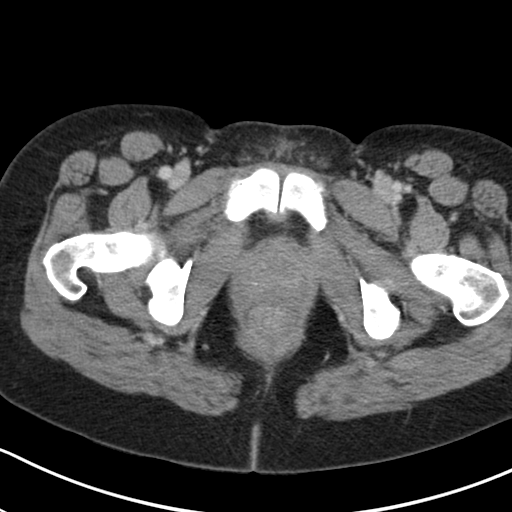
[im 17/85  soft-tissue]
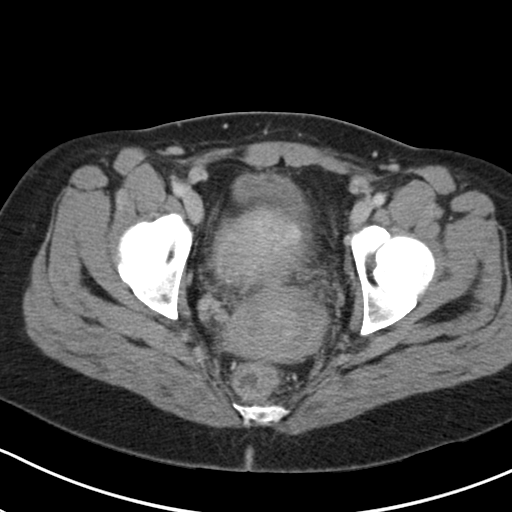
[im 23/85  soft-tissue]
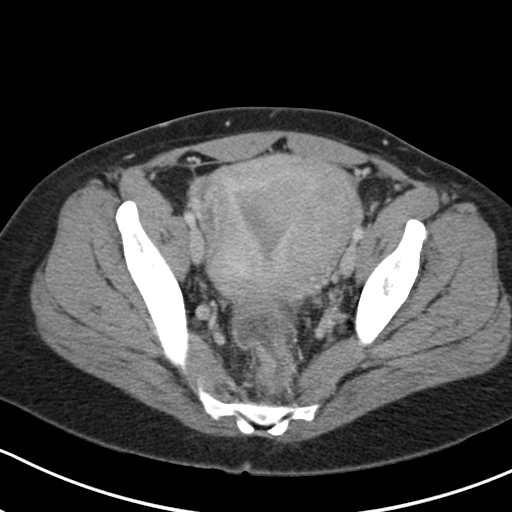
[im 30/85  soft-tissue]
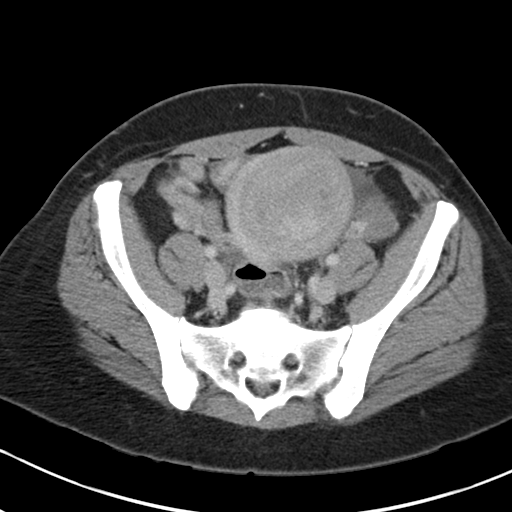
[im 36/85  soft-tissue]
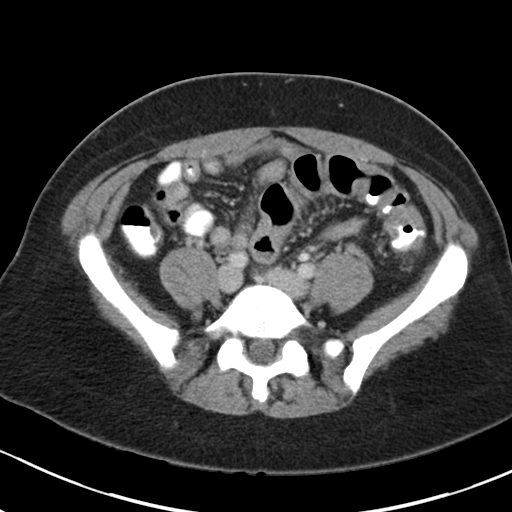
[im 43/85  soft-tissue]
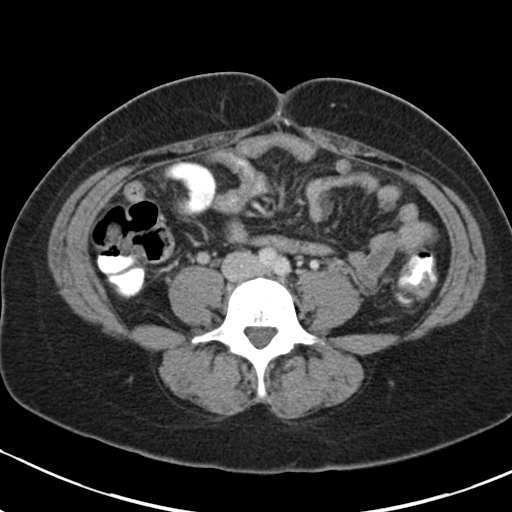
[im 49/85  soft-tissue]
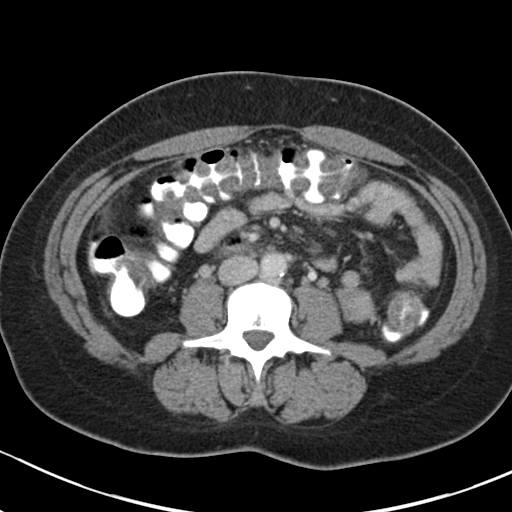
[im 55/85  soft-tissue]
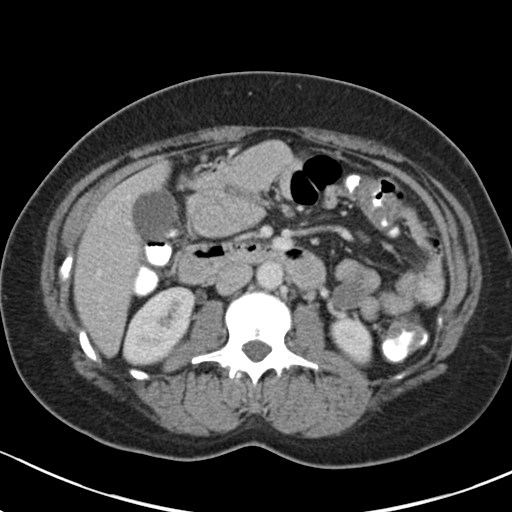
[im 55/85  bone]
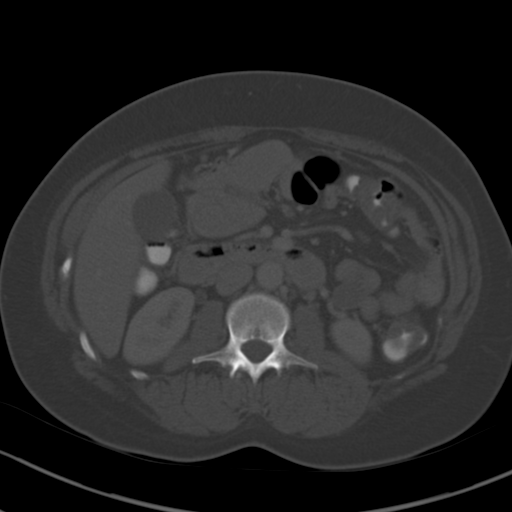
[im 62/85  soft-tissue]
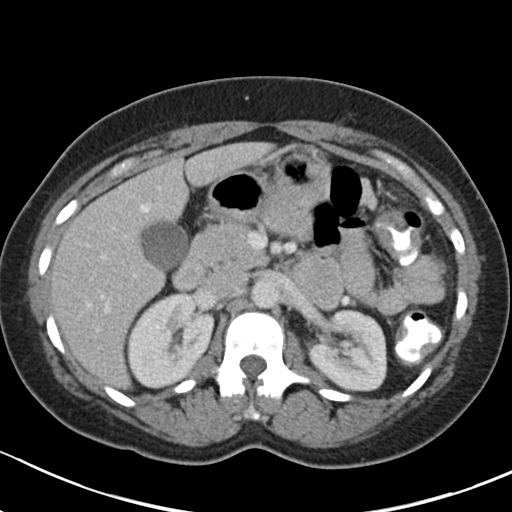
[im 68/85  soft-tissue]
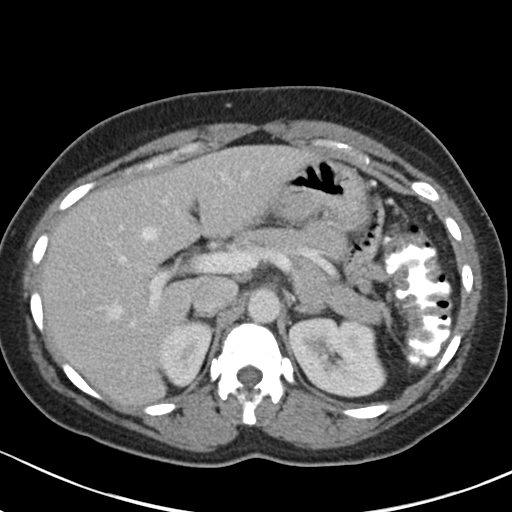
[im 72/85  lung]
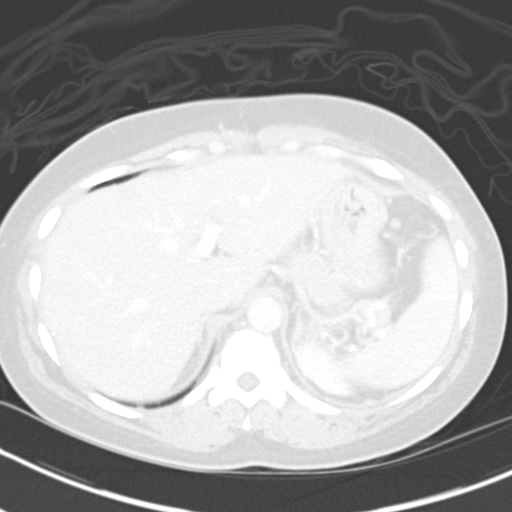
[im 75/85  soft-tissue]
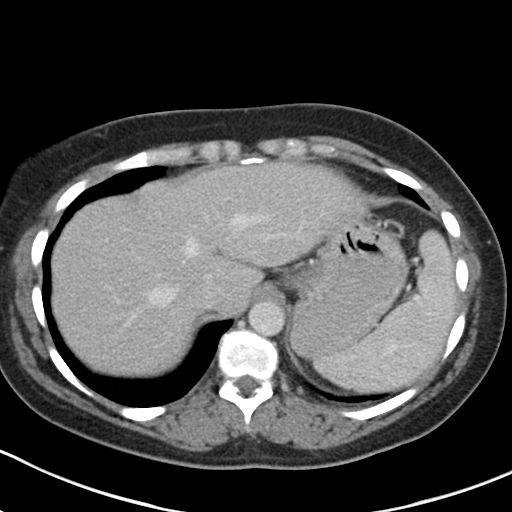
[im 75/85  lung]
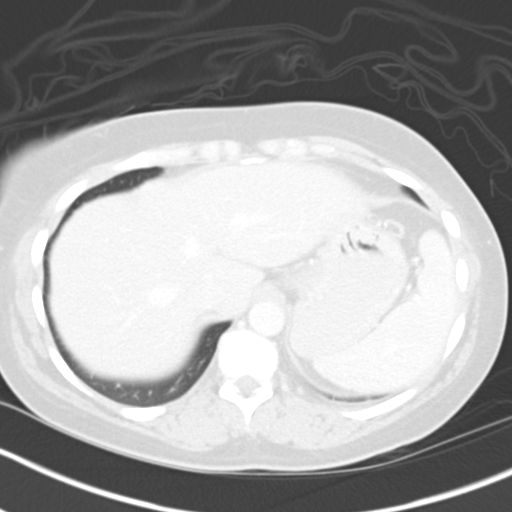
[im 78/85  lung]
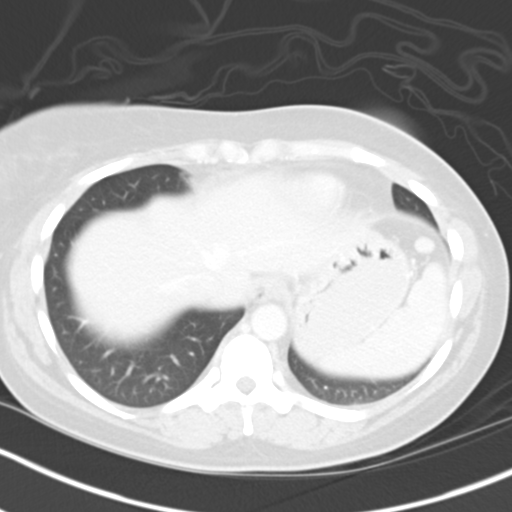
[im 81/85  soft-tissue]
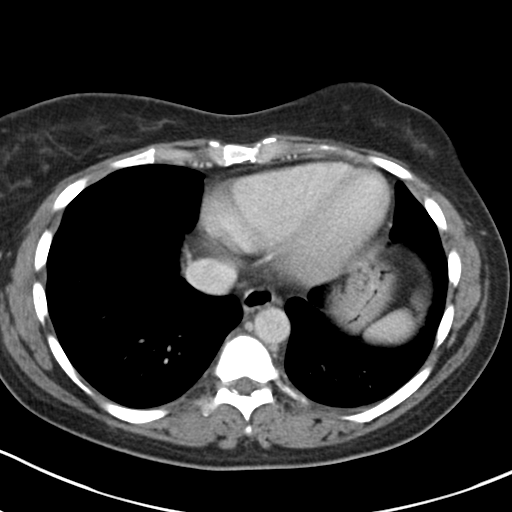
[im 81/85  lung]
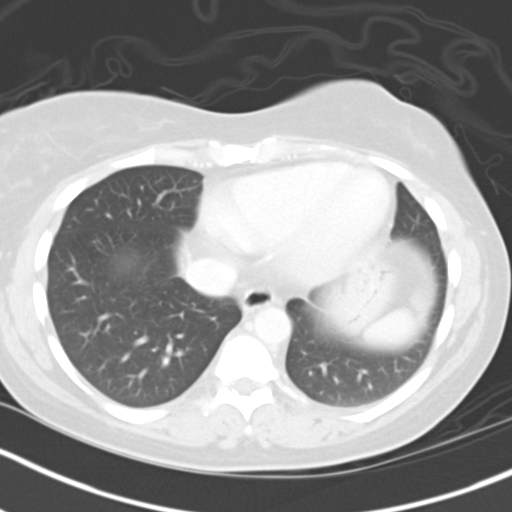

[15 of 32 positions shown; findings below may reference images not displayed]

FINDINGS: Lung bases are clear.

No pericardial or pleural effusion identified.

There is no focal liver abnormalities.

The gallbladder appears normal.  No biliary dilatation.

Normal appearance of the pancreas.

The adrenal glands are negative.

Normal appearance of the kidneys.

No enlarged upper abdominal lymph nodes.

There is no enlarged pelvic or inguinal lymph nodes.  Urinary
bladder appears normal.

Enlarged fibroid uterus is identified.  Along the uterine fundus
fibroid is noted measuring 7.2 cm.

The stomach appears normal.

The small bowel loops are negative.  No evidence for small bowel
obstruction. The appendix is identified and appears normal.

There is diffuse abnormal wall thickening involving the entire
colon up to the level of the rectum.

Pericolonic haziness and free fluid is noted.

No evidence for bowel perforation or abscess formation.

Review of the visualized osseous structures is unremarkable.
IMPRESSION: 1.  Examination is positive for colitis.  Likely inflammatory or
infectious in etiology.  Ischemic colitis cannot be excluded.
2.  Fibroid uterus.

## 2014-03-26 ENCOUNTER — Encounter (HOSPITAL_COMMUNITY): Payer: Self-pay | Admitting: Emergency Medicine

## 2014-03-26 ENCOUNTER — Emergency Department (HOSPITAL_COMMUNITY)
Admission: EM | Admit: 2014-03-26 | Discharge: 2014-03-26 | Disposition: A | Discharge status: Home / Self Care | Payer: PRIVATE HEALTH INSURANCE | Attending: Emergency Medicine | Admitting: Emergency Medicine

## 2014-03-26 ENCOUNTER — Emergency Department (HOSPITAL_COMMUNITY): Payer: PRIVATE HEALTH INSURANCE

## 2014-03-26 DIAGNOSIS — F17200 Nicotine dependence, unspecified, uncomplicated: Secondary | ICD-10-CM | POA: Insufficient documentation

## 2014-03-26 DIAGNOSIS — A5909 Other urogenital trichomoniasis: Secondary | ICD-10-CM | POA: Insufficient documentation

## 2014-03-26 DIAGNOSIS — N949 Unspecified condition associated with female genital organs and menstrual cycle: Secondary | ICD-10-CM | POA: Insufficient documentation

## 2014-03-26 DIAGNOSIS — N938 Other specified abnormal uterine and vaginal bleeding: Secondary | ICD-10-CM | POA: Insufficient documentation

## 2014-03-26 DIAGNOSIS — N730 Acute parametritis and pelvic cellulitis: Secondary | ICD-10-CM | POA: Insufficient documentation

## 2014-03-26 DIAGNOSIS — Z3202 Encounter for pregnancy test, result negative: Secondary | ICD-10-CM | POA: Insufficient documentation

## 2014-03-26 LAB — CBC
HCT: 28.7 % — ABNORMAL LOW (ref 36.0–46.0)
Hemoglobin: 8.8 g/dL — ABNORMAL LOW (ref 12.0–15.0)
MCH: 23.7 pg — ABNORMAL LOW (ref 26.0–34.0)
MCHC: 30.7 g/dL (ref 30.0–36.0)
MCV: 77.4 fL — ABNORMAL LOW (ref 78.0–100.0)
Platelets: 631 10*3/uL — ABNORMAL HIGH (ref 150–400)
RBC: 3.71 MIL/uL — AB (ref 3.87–5.11)
RDW: 19 % — ABNORMAL HIGH (ref 11.5–15.5)
WBC: 6.1 10*3/uL (ref 4.0–10.5)

## 2014-03-26 LAB — WET PREP, GENITAL: YEAST WET PREP: NONE SEEN

## 2014-03-26 LAB — URINALYSIS, ROUTINE W REFLEX MICROSCOPIC
Bilirubin Urine: NEGATIVE
GLUCOSE, UA: NEGATIVE mg/dL
Hgb urine dipstick: NEGATIVE
KETONES UR: NEGATIVE mg/dL
LEUKOCYTES UA: NEGATIVE
Nitrite: NEGATIVE
PH: 7 (ref 5.0–8.0)
Protein, ur: NEGATIVE mg/dL
Specific Gravity, Urine: 1.021 (ref 1.005–1.030)
Urobilinogen, UA: 0.2 mg/dL (ref 0.0–1.0)

## 2014-03-26 LAB — COMPREHENSIVE METABOLIC PANEL
ALT: 22 U/L (ref 0–35)
AST: 28 U/L (ref 0–37)
Albumin: 3.5 g/dL (ref 3.5–5.2)
Alkaline Phosphatase: 140 U/L — ABNORMAL HIGH (ref 39–117)
BILIRUBIN TOTAL: 0.2 mg/dL — AB (ref 0.3–1.2)
BUN: 9 mg/dL (ref 6–23)
CHLORIDE: 107 meq/L (ref 96–112)
CO2: 24 meq/L (ref 19–32)
Calcium: 9.5 mg/dL (ref 8.4–10.5)
Creatinine, Ser: 0.83 mg/dL (ref 0.50–1.10)
GFR calc Af Amer: >90 mL/min (ref >90)
GFR, EST NON AFRICAN AMERICAN: 86 mL/min — AB (ref >90)
Glucose, Bld: 82 mg/dL (ref 70–99)
POTASSIUM: 4.2 meq/L (ref 3.7–5.3)
SODIUM: 143 meq/L (ref 137–147)
Total Protein: 7.8 g/dL (ref 6.0–8.3)

## 2014-03-26 LAB — PREGNANCY, URINE: Preg Test, Ur: NEGATIVE

## 2014-03-26 LAB — GC/CHLAMYDIA PROBE AMP
CT Probe RNA: NEGATIVE
GC Probe RNA: NEGATIVE

## 2014-03-26 LAB — I-STAT CG4 LACTIC ACID, ED: Lactic Acid, Venous: 1.69 mmol/L (ref 0.5–2.2)

## 2014-03-26 MED ORDER — ONDANSETRON 4 MG PO TBDP
4.0000 mg | ORAL_TABLET | Freq: Once | ORAL | Status: AC
  Administered 2014-03-26: 4 mg via ORAL
  Filled 2014-03-26: qty 1

## 2014-03-26 MED ORDER — DIPHENHYDRAMINE HCL 50 MG/ML IJ SOLN
25.0000 mg | Freq: Once | INTRAMUSCULAR | Status: AC
  Administered 2014-03-26: 25 mg via INTRAVENOUS
  Filled 2014-03-26: qty 1

## 2014-03-26 MED ORDER — AZITHROMYCIN 250 MG PO TABS
1000.0000 mg | ORAL_TABLET | Freq: Once | ORAL | Status: AC
  Administered 2014-03-26: 1000 mg via ORAL
  Filled 2014-03-26: qty 4

## 2014-03-26 MED ORDER — METRONIDAZOLE 500 MG PO TABS
2000.0000 mg | ORAL_TABLET | Freq: Once | ORAL | Status: AC
  Administered 2014-03-26: 2000 mg via ORAL
  Filled 2014-03-26: qty 4

## 2014-03-26 MED ORDER — HYDROCODONE-ACETAMINOPHEN 5-325 MG PO TABS: 1.0000 | ORAL_TABLET | Freq: Four times a day (QID) | ORAL | Status: DC | PRN

## 2014-03-26 MED ORDER — HYDROMORPHONE HCL PF 1 MG/ML IJ SOLN
1.0000 mg | Freq: Once | INTRAMUSCULAR | Status: AC
  Administered 2014-03-26: 1 mg via INTRAVENOUS
  Filled 2014-03-26: qty 1

## 2014-03-26 MED ORDER — DOXYCYCLINE HYCLATE 100 MG PO TABS: 100.0000 mg | ORAL_TABLET | Freq: Two times a day (BID) | ORAL | Status: DC

## 2014-03-26 MED ORDER — IOHEXOL 300 MG/ML  SOLN
50.0000 mL | Freq: Once | INTRAMUSCULAR | Status: AC | PRN
Start: 2014-03-26 — End: 2014-03-26
  Administered 2014-03-26: 50 mL via ORAL

## 2014-03-26 MED ORDER — IOHEXOL 300 MG/ML  SOLN
100.0000 mL | Freq: Once | INTRAMUSCULAR | Status: AC | PRN
  Administered 2014-03-26: 100 mL via INTRAVENOUS

## 2014-03-26 MED ORDER — HYDROCODONE-ACETAMINOPHEN 5-325 MG PO TABS
1.0000 | ORAL_TABLET | Freq: Once | ORAL | Status: AC
  Administered 2014-03-26: 1 via ORAL
  Filled 2014-03-26: qty 1

## 2014-03-26 MED ORDER — DOXYCYCLINE HYCLATE 100 MG PO TABS
100.0000 mg | ORAL_TABLET | Freq: Once | ORAL | Status: AC
  Administered 2014-03-26: 100 mg via ORAL
  Filled 2014-03-26: qty 1

## 2014-03-26 MED ORDER — ONDANSETRON HCL 4 MG/2ML IJ SOLN
4.0000 mg | Freq: Once | INTRAMUSCULAR | Status: AC
  Administered 2014-03-26: 4 mg via INTRAVENOUS
  Filled 2014-03-26: qty 2

## 2014-03-26 MED ORDER — CEFTRIAXONE SODIUM 250 MG IJ SOLR
250.0000 mg | Freq: Once | INTRAMUSCULAR | Status: AC
  Administered 2014-03-26: 250 mg via INTRAMUSCULAR
  Filled 2014-03-26: qty 250

## 2014-03-26 MED ORDER — LIDOCAINE HCL 1 % IJ SOLN
INTRAMUSCULAR | Status: AC
  Administered 2014-03-26: 0.9 mL
  Filled 2014-03-26: qty 20

## 2014-03-26 NOTE — ED Notes (Signed)
Pt to CT at present time.

## 2014-03-26 NOTE — ED Notes (Signed)
Pt reports increase in pain and generalized itching. Mingo Amber MD notified.

## 2014-03-26 NOTE — ED Provider Notes (Signed)
CSN: [id number]     Arrival date & time 03/26/14  0911 History   First MD Initiated Contact with Patient 03/26/14 0912     Chief Complaint  Patient presents with  . Abdominal Pain     (Consider location/radiation/quality/duration/timing/severity/associated sxs/prior Treatment) Patient is a 43 y.o. female presenting with abdominal pain.  Abdominal Pain Pain location:  Generalized Pain quality: aching, cramping and sharp   Pain radiates to:  Does not radiate Pain severity:  Severe Onset quality:  Sudden Timing:  Intermittent Progression:  Worsening Chronicity:  Recurrent Context: alcohol use (last night)   Context: not eating, not sick contacts and not trauma   Relieved by:  Nothing Worsened by:  Nothing tried Associated symptoms: no chills and no fever     History reviewed. No pertinent past medical history. History reviewed. No pertinent past surgical history. No family history on file. History  Substance Use Topics  . Smoking status: Current Every Day Smoker -- 0.50 packs/day    Types: Cigarettes  . Smokeless tobacco: Not on file  . Alcohol Use: Yes   OB History   Grav Para Term Preterm Abortions TAB SAB Ect Mult Living                 Review of Systems  Constitutional: Negative for fever and chills.  Gastrointestinal: Positive for abdominal pain.  All other systems reviewed and are negative.     Allergies  Review of patient's allergies indicates no known allergies.  Home Medications  No current outpatient prescriptions on file. BP 128/82  Pulse 89  Temp(Src) 98.3 F (36.8 C) (Oral)  Resp 18  SpO2 100%  LMP 03/13/2014 Physical Exam  Nursing note and vitals reviewed. Constitutional: She is oriented to person, place, and time. She appears well-developed and well-nourished. No distress.  HENT:  Head: Normocephalic and atraumatic.  Right Ear: External ear normal.  Mouth/Throat: No oropharyngeal exudate.  Eyes: EOM are normal. Pupils are equal, round,  and reactive to light.  Neck: Normal range of motion. Neck supple.  Cardiovascular: Normal rate and regular rhythm.  Exam reveals no friction rub.   No murmur heard. Pulmonary/Chest: Effort normal and breath sounds normal. No respiratory distress. She has no wheezes. She has no rales. She exhibits no tenderness.  Abdominal: Soft. She exhibits no distension. There is tenderness (Diffuse, worse on R side). There is no rebound.  Genitourinary: Cervix exhibits motion tenderness. Cervix exhibits no discharge and no friability. Right adnexum displays tenderness. Right adnexum displays no mass and no fullness. Left adnexum displays tenderness. Left adnexum displays no mass and no fullness.  Musculoskeletal: Normal range of motion. She exhibits no edema.  Neurological: She is alert and oriented to person, place, and time. She exhibits normal muscle tone.  Skin: No rash noted. She is not diaphoretic.    ED Course  Procedures (including critical care time) Labs Review Labs Reviewed  WET PREP, GENITAL  GC/CHLAMYDIA PROBE AMP  CBC  COMPREHENSIVE METABOLIC PANEL  URINALYSIS, ROUTINE W REFLEX MICROSCOPIC  I-STAT CG4 LACTIC ACID, ED   Imaging Review Ct Abdomen Pelvis W Contrast  03/26/2014   CLINICAL DATA:  Right-sided abdominal pain for 6 days.  EXAM: CT ABDOMEN AND PELVIS WITH CONTRAST  TECHNIQUE: Multidetector CT imaging of the abdomen and pelvis was performed using the standard protocol following bolus administration of intravenous contrast.  CONTRAST:  45mL OMNIPAQUE IOHEXOL 300 MG/ML SOLN, 154mL OMNIPAQUE IOHEXOL 300 MG/ML SOLN  COMPARISON:  09/22/2011  FINDINGS: Clear lung bases.  The heart is normal size.  Normal liver, spleen, gallbladder and pancreas. No bile duct dilation. No adrenal masses. Normal kidneys, ureters and bladder.  Uterus is enlarged by multiple masses. It measures 12.9 cm x 7.9 cm x 8.9 cm. Masses are consistent fibroids. They are hypo attenuating and heterogeneous. The largest  arises from the uterine fundus measuring 7.3 cm in greatest dimension. These findings are similar to the prior exam.  No adnexal masses.  No pathologically enlarged lymph nodes are seen. There is no abnormal fluid collections.  The bowel is unremarkable.  A normal appendix is visualized.  No bony abnormality.  IMPRESSION: 1. No acute findings. 2. Uterus enlarged by multiple fibroids similar to the prior study. 3. No other abnormalities.  Normal appendix visualized.   Electronically Signed   By: Lajean Manes M.D.   On: 03/26/2014 11:49     EKG Interpretation None      MDM   Final diagnoses:  Dysfunctional uterine bleeding  Trichomonal cervicitis  PID (acute pelvic inflammatory disease)    43 year old female presents with right-sided abdominal pain and right-sided body pain. States it's been going on for a week. Intermittent episodes of right arm tingling, right leg tingling, and abdominal pain. Worsening this morning. Per daughter, drank 4 beers last night. Had retching this morning. No fever. Also having vaginal bleeding for 2 weeks, normally has 3 day periods are regular. Had symptoms like this before, and noted to have colitis in the chart and 2012. Here vitals are stable. On exam, she is hanging off the end of the bed in the fetal position. She is having bouts of pain on examining her. She has diffuse abdominal tenderness with voluntary guarding diffusely. No distention or peritoneal signs consistent with surgical abdomen. We'll start with labs, CT scan, pelvic exam. Patient has normal sensation and strength in the arm and leg, doubt TIA or stroke as he has multiple other complaints also, not just isolated tingling. On pelvic exam, bleeding without clots. Mild tenderness diffusely, no masses or fullness appreciated. Hx of BTL. CT shows uterine fibroids. Labs show trichomonas. Will treat with Flagyl 2g once, doxycycline for possible PID, Azithro/Rocephin for STDs. Given vicodin. Instructed to  f/u with OB/GYN for her dysfunctional uterine bleeding and for her abdominal pain. Patient feels better, excited to go home.  Osvaldo Shipper, MD 03/26/14 [phone]

## 2014-03-26 NOTE — Discharge Instructions (Signed)
Abnormal Uterine Bleeding Abnormal uterine bleeding can affect women at various stages in life, including teenagers, women in their reproductive years, pregnant women, and women who have reached menopause. Several kinds of uterine bleeding are considered abnormal, including:  Bleeding or spotting between periods.   Bleeding after sexual intercourse.   Bleeding that is heavier or more than normal.   Periods that last longer than usual.  Bleeding after menopause.  Many cases of abnormal uterine bleeding are minor and simple to treat, while others are more serious. Any type of abnormal bleeding should be evaluated by your health care provider. Treatment will depend on the cause of the bleeding. HOME CARE INSTRUCTIONS Monitor your condition for any changes. The following actions may help to alleviate any discomfort you are experiencing:  Avoid the use of tampons and douches as directed by your health care provider.  Change your pads frequently. You should get regular pelvic exams and Pap tests. Keep all follow-up appointments for diagnostic tests as directed by your health care provider.  SEEK MEDICAL CARE IF:   Your bleeding lasts more than 1 week.   You feel dizzy at times.  SEEK IMMEDIATE MEDICAL CARE IF:   You pass out.   You are changing pads every 15 to 30 minutes.   You have abdominal pain.  You have a fever.   You become sweaty or weak.   You are passing large blood clots from the vagina.   You start to feel nauseous and vomit. MAKE SURE YOU:   Understand these instructions.  Will watch your condition.  Will get help right away if you are not doing well or get worse. Document Released: 12/02/2005 Document Revised: 08/04/2013 Document Reviewed: 07/01/2013 [facility] Patient Information 2014 [place], [place].  Pelvic Inflammatory Disease Pelvic inflammatory disease (PID) is an infection in some or all of the female organs. PID can be in the uterus,  ovaries, fallopian tubes, or the surrounding tissues inside the lower belly area (pelvis). HOME CARE   If given, take your antibiotic medicine as told. Finish them even if you start to feel better.  Only take medicine as told by your doctor.  Do not have sex (intercourse) until treatment is done or as told by your doctor.  Tell your sex partner if you have PID. Your partner may need to be treated.  Keep all doctor visits. GET HELP RIGHT AWAY IF:   You have a fever.  You have more belly (abdominal) or lower belly pain.  You have chills.  You have pain when you pee (urinate).  You are not better after 72 hours.  You have more fluid (discharge) coming from your vagina or fluid that is not normal.  You need pain medicine from your doctor.  You throw up (vomit).  You cannot take your medicines.  Your partner has a sexually transmitted disease (STD). MAKE SURE YOU:   Understand these instructions.  Will watch your condition.  Will get help right away if you are not doing well or get worse. Document Released: 02/28/2009 Document Revised: 03/29/2013 Document Reviewed: 11/28/2011 [facility] Patient Information 2014 [place], [place].  Trichomoniasis Trichomoniasis is an infection, caused by the Trichomonas organism, that affects both women and men. In women, the outer female genitalia and the vagina are affected. In men, the penis is mainly affected, but the prostate and other reproductive organs can also be involved. Trichomoniasis is a sexually transmitted disease (STD) and is most often passed to another person through sexual contact. The majority  of people who get trichomoniasis do so from a sexual encounter and are also at risk for other STDs. CAUSES   Sexual intercourse with an infected partner.  It can be present in swimming pools or hot tubs. SYMPTOMS   Abnormal gray-green frothy vaginal discharge in women.  Vaginal itching and irritation in women.  Itching and  irritation of the area outside the vagina in women.  Penile discharge with or without pain in males.  Inflammation of the urethra (urethritis), causing painful urination.  Bleeding after sexual intercourse. RELATED COMPLICATIONS  Pelvic inflammatory disease.  Infection of the uterus (endometritis).  Infertility.  Tubal (ectopic) pregnancy.  It can be associated with other STDs, including gonorrhea and chlamydia, hepatitis B, and HIV. COMPLICATIONS DURING PREGNANCY  Early (premature) delivery.  Premature rupture of the membranes (PROM).  Low birth weight. DIAGNOSIS   Visualization of Trichomonas under the microscope from the vagina discharge.  Ph of the vagina greater than 4.5, tested with a test tape.  Trich Rapid Test.  Culture of the organism, but this is not usually needed.  It may be found on a Pap test.  Having a "strawberry cervix,"which means the cervix looks very red like a strawberry. TREATMENT   You may be given medication to fight the infection. Inform your caregiver if you could be or are pregnant. Some medications used to treat the infection should not be taken during pregnancy.  Over-the-counter medications or creams to decrease itching or irritation may be recommended.  Your sexual partner will need to be treated if infected. HOME CARE INSTRUCTIONS   Take all medication prescribed by your caregiver.  Take over-the-counter medication for itching or irritation as directed by your caregiver.  Do not have sexual intercourse while you have the infection.  Do not douche or wear tampons.  Discuss your infection with your partner, as your partner may have acquired the infection from you. Or, your partner may have been the person who transmitted the infection to you.  Have your sex partner examined and treated if necessary.  Practice safe, informed, and protected sex.  See your caregiver for other STD testing. SEEK MEDICAL CARE IF:   You still  have symptoms after you finish the medication.  You have an oral temperature above 102 F (38.9 C).  You develop belly (abdominal) pain.  You have pain when you urinate.  You have bleeding after sexual intercourse.  You develop a rash.  The medication makes you sick or makes you throw up (vomit). Document Released: 05/28/2001 Document Revised: 02/24/2012 Document Reviewed: 06/23/2009 [facility] Patient Information 2014 [place], [place].

## 2014-03-26 NOTE — ED Notes (Signed)
Pt encouraged to void when able. 

## 2014-03-26 NOTE — ED Notes (Addendum)
Pt reports right sided pain for 6 days and abdominal pain all over since 3/29 when period started. Pt passing large clots with period. Pt states has diarrhea with period. Pt reports fetal position helps abdominal pain when it comes on.  Pt went out last night and had 4 budweiser.

## 2014-04-25 ENCOUNTER — Emergency Department (INDEPENDENT_AMBULATORY_CARE_PROVIDER_SITE_OTHER)
Admission: EM | Admit: 2014-04-25 | Discharge: 2014-04-25 | Disposition: A | Discharge status: Home / Self Care | Payer: PRIVATE HEALTH INSURANCE | Source: Home / Self Care | Attending: Family Medicine | Admitting: Family Medicine

## 2014-04-25 ENCOUNTER — Encounter (HOSPITAL_COMMUNITY): Payer: Self-pay | Admitting: Emergency Medicine

## 2014-04-25 DIAGNOSIS — M5412 Radiculopathy, cervical region: Secondary | ICD-10-CM

## 2014-04-25 MED ORDER — PREDNISONE 10 MG PO KIT: PACK | ORAL | Status: DC

## 2014-04-25 NOTE — Discharge Instructions (Signed)
Thank you for coming in today. Take prednisone daily for 12 days. Use a wrist splint especially at night.  Followup with Dr. Alfonso Ramus at [facility] not getting better.   Cervical Radiculopathy Cervical radiculopathy happens when a nerve in the neck is pinched or bruised by a slipped (herniated) disk or by arthritic changes in the bones of the cervical spine. This can occur due to an injury or as part of the normal aging process. Pressure on the cervical nerves can cause pain or numbness that runs from your neck all the way down into your arm and fingers. CAUSES  There are many possible causes, including:  Injury.  Muscle tightness in the neck from overuse.  Swollen, painful joints (arthritis).  Breakdown or degeneration in the bones and joints of the spine (spondylosis) due to aging.  Bone spurs that may develop near the cervical nerves. SYMPTOMS  Symptoms include pain, weakness, or numbness in the affected arm and hand. Pain can be severe or irritating. Symptoms may be worse when extending or turning the neck. DIAGNOSIS  Your caregiver will ask about your symptoms and do a physical exam. He or she may test your strength and reflexes. X-rays, CT scans, and MRI scans may be needed in cases of injury or if the symptoms do not go away after a period of time. Electromyography (EMG) or nerve conduction testing may be done to study how your nerves and muscles are working. TREATMENT  Your caregiver may recommend certain exercises to help relieve your symptoms. Cervical radiculopathy can, and often does, get better with time and treatment. If your problems continue, treatment options may include:  Wearing a soft collar for short periods of time.  Physical therapy to strengthen the neck muscles.  Medicines, such as nonsteroidal anti-inflammatory drugs (NSAIDs), oral corticosteroids, or spinal injections.  Surgery. Different types of surgery may be done depending on the cause of  your problems. HOME CARE INSTRUCTIONS   Put ice on the affected area.  Put ice in a plastic bag.  Place a towel between your skin and the bag.  Leave the ice on for 15-20 minutes, 03-04 times a day or as directed by your caregiver.  If ice does not help, you can try using heat. Take a warm shower or bath, or use a hot water bottle as directed by your caregiver.  You may try a gentle neck and shoulder massage.  Use a flat pillow when you sleep.  Only take over-the-counter or prescription medicines for pain, discomfort, or fever as directed by your caregiver.  If physical therapy was prescribed, follow your caregiver's directions.  If a soft collar was prescribed, use it as directed. SEEK IMMEDIATE MEDICAL CARE IF:   Your pain gets much worse and cannot be controlled with medicines.  You have weakness or numbness in your hand, arm, face, or leg.  You have a high fever or a stiff, rigid neck.  You lose bowel or bladder control (incontinence).  You have trouble with walking, balance, or speaking. MAKE SURE YOU:   Understand these instructions.  Will watch your condition.  Will get help right away if you are not doing well or get worse. Document Released: 08/27/2001 Document Revised: 02/24/2012 Document Reviewed: 07/16/2011 [facility] Patient Information 2014 [place], [place]. Carpal Tunnel Syndrome The carpal tunnel is a narrow area located on the palm side of your wrist. The tunnel is formed by the wrist bones and ligaments. Nerves, blood vessels, and tendons pass through the carpal tunnel.  Repeated wrist motion or certain diseases may cause swelling within the tunnel. This swelling pinches the main nerve in the wrist (median nerve) and causes the painful hand and arm condition called carpal tunnel syndrome. CAUSES   Repeated wrist motions.  Wrist injuries.  Certain diseases like arthritis, diabetes, alcoholism, hyperthyroidism, and kidney  failure.  Obesity.  Pregnancy. SYMPTOMS   A "pins and needles" feeling in your fingers or hand.  Tingling or numbness in your fingers or hand.  An aching feeling in your entire arm.  Wrist pain that goes up your arm to your shoulder.  Pain that goes down into your palm or fingers.  A weak feeling in your hands. DIAGNOSIS  Your caregiver will take your history and perform a physical exam. An electromyography test may be needed. This test measures electrical signals sent out by the muscles. The electrical signals are usually slowed by carpal tunnel syndrome. You may also need X-rays. TREATMENT  Carpal tunnel syndrome may clear up by itself. Your caregiver may recommend a wrist splint or medicine such as a nonsteroidal anti-inflammatory medicine. Cortisone injections may help. Sometimes, surgery may be needed to free the pinched nerve.  HOME CARE INSTRUCTIONS   Take all medicine as directed by your caregiver. Only take over-the-counter or prescription medicines for pain, discomfort, or fever as directed by your caregiver.  If you were given a splint to keep your wrist from bending, wear it as directed. It is important to wear the splint at night. Wear the splint for as long as you have pain or numbness in your hand, arm, or wrist. This may take 1 to 2 months.  Rest your wrist from any activity that may be causing your pain. If your symptoms are work-related, you may need to talk to your employer about changing to a job that does not require using your wrist.  Put ice on your wrist after long periods of wrist activity.  Put ice in a plastic bag.  Place a towel between your skin and the bag.  Leave the ice on for 15-20 minutes, 03-04 times a day.  Keep all follow-up visits as directed by your caregiver. This includes any orthopedic referrals, physical therapy, and rehabilitation. Any delay in getting necessary care could result in a delay or failure of your condition to heal. SEEK  IMMEDIATE MEDICAL CARE IF:   You have new, unexplained symptoms.  Your symptoms get worse and are not helped or controlled with medicines. MAKE SURE YOU:   Understand these instructions.  Will watch your condition.  Will get help right away if you are not doing well or get worse. Document Released: 11/29/2000 Document Revised: 02/24/2012 Document Reviewed: 10/18/2011 [facility] Patient Information 2014 [place], [place].

## 2014-04-25 NOTE — ED Provider Notes (Signed)
Leah Lane is a 42 y.o. female who presents to Urgent Care today for right arm pain. Patient notes pain radiating from her right neck to her right first 3 digits. She notes a tingling  To electrical sensation. Symptoms are worse with neck motion and palpation of her elbow and wrist. She was seen in the emergency room for an unrelated issue April 11. She had an IV at that time that for painful. Since then she's noted pain in her right antecubital fossa. She attributes her current pain to that incident. She denies any injury fevers chills nausea vomiting or diarrhea. She denies any significant weakness.   No past medical history on file. History  Substance Use Topics  . Smoking status: Current Every Day Smoker -- 0.50 packs/day    Types: Cigarettes  . Smokeless tobacco: Not on file  . Alcohol Use: Yes   ROS as above Medications: No current facility-administered medications for this encounter.   Current Outpatient Prescriptions  Medication Sig Dispense Refill  . PredniSONE 10 MG KIT 12 day dose pack po  1 kit  0    Exam:  BP 123/70  Pulse 71  Temp(Src) 98.2 F (36.8 C) (Oral)  Resp 16  SpO2 100%  LMP 03/13/2014 Gen: Well NAD HEENT: EOMI,  MMM Lungs: Normal work of breathing. CTABL Heart: RRR no MRG Abd: NABS, Soft. NT, ND Exts: Brisk capillary refill, warm and well perfused.  Neck: Normal flexion extension and rotation. Painful right rotation and right lateral flexion. Mildly positive right Spurling's test. Upper extremity strength is intact throughout. Reflexes and sensation are also intact. Pulses are intact. Antecubital fossa with tiny papule over her antecubital vein with mild tenderness without erythema or induration or nodule.  Wrist with normal motion and nontender. Positive Tinel's over the carpal tunnel. No thenar atrophy present  No results found for this or any previous visit (from the past 24 hour(s)). No results found.  Assessment and Plan: 43 y.o. female with  right arm pain. Patient has cervical radiculopathy, or carpal tunnel syndrome, or both. Plan to treat with prednisone and a cockup wrist splint. Followup with orthopedics if not improved.  Discussed warning signs or symptoms. Please see discharge instructions. Patient expresses understanding.    Gregor Hams, MD 04/25/14 [phone]

## 2014-04-25 NOTE — ED Notes (Signed)
Right arm pain for 2-3 weeks.

## 2014-04-25 NOTE — ED Notes (Signed)
Pt states that she is unable to tolerate the wrist splint's pressure. I tried an Ace wrap. Pt unable to tolerate that as well. Pt states that can't tolerate any pressure on this RIGHT UE. Dr. Georgina Snell advised.

## 2016-03-29 ENCOUNTER — Encounter (HOSPITAL_COMMUNITY): Payer: Self-pay

## 2016-03-29 ENCOUNTER — Ambulatory Visit: Payer: Self-pay

## 2016-03-29 ENCOUNTER — Emergency Department (HOSPITAL_COMMUNITY)
Admission: EM | Admit: 2016-03-29 | Discharge: 2016-03-29 | Disposition: A | Discharge status: Home / Self Care | Payer: 59 | Attending: Emergency Medicine | Admitting: Emergency Medicine

## 2016-03-29 DIAGNOSIS — S30860D Insect bite (nonvenomous) of lower back and pelvis, subsequent encounter: Secondary | ICD-10-CM | POA: Diagnosis present

## 2016-03-29 DIAGNOSIS — F17210 Nicotine dependence, cigarettes, uncomplicated: Secondary | ICD-10-CM | POA: Diagnosis not present

## 2016-03-29 DIAGNOSIS — L03317 Cellulitis of buttock: Secondary | ICD-10-CM

## 2016-03-29 DIAGNOSIS — W57XXXD Bitten or stung by nonvenomous insect and other nonvenomous arthropods, subsequent encounter: Secondary | ICD-10-CM | POA: Diagnosis not present

## 2016-03-29 MED ORDER — NAPROXEN 500 MG PO TABS
500.0000 mg | ORAL_TABLET | Freq: Once | ORAL | Status: AC
  Administered 2016-03-29: 500 mg via ORAL
  Filled 2016-03-29: qty 1

## 2016-03-29 MED ORDER — CEPHALEXIN 500 MG PO CAPS: 500.0000 mg | ORAL_CAPSULE | Freq: Four times a day (QID) | ORAL | Status: DC

## 2016-03-29 MED ORDER — NAPROXEN 500 MG PO TABS: 500.0000 mg | ORAL_TABLET | Freq: Two times a day (BID) | ORAL | Status: DC

## 2016-03-29 NOTE — Discharge Instructions (Signed)
Take your medications as prescribed. I recommend eating prior to taking the naproxen to prevent gastrointestinal side effects. I also recommend applying a warm compress to lesion for 15-20 minutes 3-4 times daily. You may also apply over-the-counter Benadryl ointment to lesion or take oral Benadryl to help with itching.  Follow-up with your primary care provider or return to the ED in 3-5 days if your lesion has not improved or has worsened. Return to the emergency department if symptoms worsen or new onset of fever, worsening redness, warmth, drainage, worsening pain, difficulty having a bowel movement.

## 2016-03-29 NOTE — ED Notes (Signed)
Pt c/o insect bite on R buttocks x 2 days.  Pain score 8/10.  Denies drainage.  No discoloration noted.  Swelling and small puncture noted.

## 2016-03-29 NOTE — ED Provider Notes (Signed)
CSN: [id number]     Arrival date & time 03/29/16  1003 History   First MD Initiated Contact with Patient 03/29/16 1122     Chief Complaint  Patient presents with  . Insect Bite     (Consider location/radiation/quality/duration/timing/severity/associated sxs/prior Treatment) HPI   Patient is a 45 year old female with no pertinent past medical history who presents the ED with reported insect bite, onset 2 days. Patient reports having worsening pain, redness and itching to a bug bite on her right buttocks. She notes she has been using hydrocortisone cream and taking Tylenol at home without relief. Denies drainage, patient denies any other known injury or trauma to the area. Denies any new contact irritants including soaps, lotions, detergents or taking any new medications. Patient denies any other family members having similar lesions. Denies fever, chills, sore throat, mouth lesions, abdominal pain, nausea, vomiting, chest pain, shortness of breath, numbness, tickling.  History reviewed. No pertinent past medical history. Past Surgical History  Procedure Laterality Date  . Hernia repair     History reviewed. No pertinent family history. Social History  Substance Use Topics  . Smoking status: Current Every Day Smoker -- 0.50 packs/day    Types: Cigarettes  . Smokeless tobacco: None  . Alcohol Use: Yes   OB History    No data available     Review of Systems  Constitutional: Negative for fever.  HENT: Negative for sore throat.   Respiratory: Negative for shortness of breath.   Cardiovascular: Negative for chest pain.  Gastrointestinal: Negative for nausea, vomiting and abdominal pain.  Skin: Positive for wound.      Allergies  Review of patient's allergies indicates no known allergies.  Home Medications   Prior to Admission medications   Medication Sig Start Date End Date Taking? Authorizing Provider  cephALEXin (KEFLEX) 500 MG capsule Take 1 capsule (500 mg total) by  mouth 4 (four) times daily. 03/29/16   Nona Dell, PA-C  naproxen (NAPROSYN) 500 MG tablet Take 1 tablet (500 mg total) by mouth 2 (two) times daily. 03/29/16   Nona Dell, PA-C  PredniSONE 10 MG KIT 12 day dose pack po 04/25/14   Gregor Hams, MD   BP 116/86 mmHg  Pulse 94  Temp(Src) 98 F (36.7 C) (Oral)  Resp 16  SpO2 100%  LMP 03/20/2016 Physical Exam  Constitutional: She is oriented to person, place, and time. She appears well-developed and well-nourished.  HENT:  Head: Normocephalic and atraumatic.  Mouth/Throat: Uvula is midline, oropharynx is clear and moist and mucous membranes are normal. No oropharyngeal exudate, posterior oropharyngeal edema, posterior oropharyngeal erythema or tonsillar abscesses.  Eyes: Conjunctivae and EOM are normal. Pupils are equal, round, and reactive to light. Right eye exhibits no discharge. Left eye exhibits no discharge. No scleral icterus.  Neck: Normal range of motion. Neck supple.  Cardiovascular: Normal rate, regular rhythm, normal heart sounds and intact distal pulses.   Pulmonary/Chest: Effort normal and breath sounds normal. No respiratory distress. She has no wheezes. She has no rales. She exhibits no tenderness.  Abdominal: Soft. Bowel sounds are normal. She exhibits no distension. There is no tenderness.  Musculoskeletal: Normal range of motion. She exhibits no edema.  Lymphadenopathy:    She has no cervical adenopathy.  Neurological: She is alert and oriented to person, place, and time.  Skin: Skin is warm and dry. No rash noted.  2x2cm area of induration with surrounding erythema noted to right medial superior buttocks, TTP. No surrounding warmth,  swelling or drainage. Excoriations present.  Nursing note and vitals reviewed.   ED Course  [place] bedside Date/Time: 03/29/2016 11:51 AM Performed by: Nona Dell Authorized by: Nona Dell Consent: Verbal consent obtained. Risks and  benefits: risks, benefits and alternatives were discussed Consent given by: patient Patient understanding: patient states understanding of the procedure being performed Patient identity confirmed: verbally with patient Patient tolerance: Patient tolerated the procedure well with no immediate complications Comments: US performed to skin lesion on right buttocks revealed cobblestoning consistent with inflammation, no evidence of abscess/area of fluid collection.    (including critical care time) Labs Review Labs Reviewed - No data to display  Imaging Review No results found. I have personally reviewed and evaluated these images and lab results as part of my medical decision-making.   EKG Interpretation None      MDM   Final diagnoses:  Cellulitis of buttock    Pt is without risk factors for HIV; no recent use of oral steroids or other immunosuppressive medications; no Hx of diabetes.  Pt is without gross abscess or evidence of abscess on [place] for which I&D would be possible.  Pt encouraged to return if redness begins to streak and/or fever or nausea/vomiting develop.  Pt is alert, oriented, NAD, afebrile, non tachycardic, nonseptic and nontoxic appearing.  Pt to be d/c on oral antibiotics and discussed symptomatic treatment.  Evaluation does not show pathology requring ongoing emergent intervention or admission. Pt is hemodynamically stable and mentating appropriately. Discussed findings/results and plan with patient/guardian, who agrees with plan. All questions answered. Return precautions discussed and outpatient follow up given.         Chesley Noon Zena, PA-C 03/29/16 [place], DO 03/31/16 1427

## 2017-10-15 ENCOUNTER — Emergency Department (HOSPITAL_COMMUNITY)
Admission: EM | Admit: 2017-10-15 | Discharge: 2017-10-15 | Disposition: A | Discharge status: Home / Self Care | Payer: 59 | Attending: Emergency Medicine | Admitting: Emergency Medicine

## 2017-10-15 ENCOUNTER — Encounter (HOSPITAL_COMMUNITY): Payer: Self-pay | Admitting: Emergency Medicine

## 2017-10-15 DIAGNOSIS — Z79899 Other long term (current) drug therapy: Secondary | ICD-10-CM | POA: Insufficient documentation

## 2017-10-15 DIAGNOSIS — R0789 Other chest pain: Secondary | ICD-10-CM

## 2017-10-15 DIAGNOSIS — F17210 Nicotine dependence, cigarettes, uncomplicated: Secondary | ICD-10-CM | POA: Insufficient documentation

## 2017-10-15 MED ORDER — KETOROLAC TROMETHAMINE 60 MG/2ML IM SOLN
30.0000 mg | Freq: Once | INTRAMUSCULAR | Status: AC
  Administered 2017-10-15: 30 mg via INTRAMUSCULAR
  Filled 2017-10-15: qty 2

## 2017-10-15 MED ORDER — CYCLOBENZAPRINE HCL 10 MG PO TABS: 10.0000 mg | ORAL_TABLET | Freq: Two times a day (BID) | ORAL | 0 refills | Status: DC | PRN

## 2017-10-15 MED ORDER — CYCLOBENZAPRINE HCL 10 MG PO TABS
10.0000 mg | ORAL_TABLET | Freq: Once | ORAL | Status: AC
  Administered 2017-10-15: 10 mg via ORAL
  Filled 2017-10-15: qty 1

## 2017-10-15 MED ORDER — OXYCODONE-ACETAMINOPHEN 5-325 MG PO TABS
1.0000 | ORAL_TABLET | Freq: Once | ORAL | Status: AC
  Administered 2017-10-15: 1 via ORAL
  Filled 2017-10-15: qty 1

## 2017-10-15 MED ORDER — PREDNISONE 50 MG PO TABS: ORAL_TABLET | ORAL | 0 refills | Status: DC

## 2017-10-15 MED ORDER — ONDANSETRON 4 MG PO TBDP
4.0000 mg | ORAL_TABLET | Freq: Once | ORAL | Status: AC
  Administered 2017-10-15: 4 mg via ORAL
  Filled 2017-10-15: qty 1

## 2017-10-15 NOTE — ED Triage Notes (Signed)
Per patient states she was moving and lifting heavy objects-strained her rigtht pectoralis muscle

## 2017-10-15 NOTE — Discharge Instructions (Signed)
The muscle relaxant will make you sleepy so do not drive while taking it.

## 2017-10-15 NOTE — ED Provider Notes (Signed)
[facility] DEPT Provider Note   CSN: [id number] Arrival date & time: 10/15/17  1750     History   Chief Complaint Chief Complaint  Patient presents with  . pulled muscle    HPI Leah Lane is a 46 y.o. female who presents to the ED with chest wall pain after heavy lifting. Patient reports that she was lifting and then turned to put the object down and when she twisted she felt a sharp pain in the right chest area. She took Aleve last night without relief. Today the pain seems worse. Patient states she has just been trying to sleep the pain away.  The history is provided by the patient. No language interpreter was used.  Muscle Pain  This is a new problem. The current episode started yesterday. The problem occurs constantly. The problem has been gradually worsening. Associated symptoms include chest pain. Pertinent negatives include no abdominal pain. The symptoms are aggravated by bending, walking, twisting and coughing. Nothing relieves the symptoms.    History reviewed. No pertinent past medical history.  There are no active problems to display for this patient.   Past Surgical History:  Procedure Laterality Date  . HERNIA REPAIR      OB History    No data available       Home Medications    Prior to Admission medications   Medication Sig Start Date End Date Taking? Authorizing Provider  naproxen sodium (ALEVE) 220 MG tablet Take 440 mg by mouth daily as needed (pain).    Yes [provider]  cyclobenzaprine (FLEXERIL) 10 MG tablet Take 1 tablet (10 mg total) by mouth 2 (two) times daily as needed for muscle spasms. 10/15/17   Ashley Murrain, NP  predniSONE (DELTASONE) 50 MG tablet Take one tablet daily. 10/15/17   Ashley Murrain, NP    Family History No family history on file.  Social History Social History  Substance Use Topics  . Smoking status: Current Every Day Smoker    Packs/day: 0.50    Types: Cigarettes  .  Smokeless tobacco: Not on file  . Alcohol use Yes     Allergies   Patient has no known allergies.   Review of Systems Review of Systems  Constitutional: Negative for chills and fever.  HENT: Negative.   Cardiovascular: Positive for chest pain.  Gastrointestinal: Negative for abdominal pain, nausea and vomiting.  Genitourinary: Negative for decreased urine volume and dysuria.  Musculoskeletal: Negative for back pain.  Skin: Negative for wound.  Neurological: Positive for light-headedness.  Psychiatric/Behavioral: Negative for confusion. The patient is not nervous/anxious.      Physical Exam Updated Vital Signs BP 106/76 (BP Location: Right Arm)   Pulse 78   Temp 98.6 F (37 C) (Oral)   Resp 16   SpO2 100%   Physical Exam  Constitutional: She appears well-developed and well-nourished. No distress.  HENT:  Head: Normocephalic and atraumatic.  Eyes: EOM are normal.  Neck: Neck supple.  Cardiovascular: Normal rate and regular rhythm.   Pulmonary/Chest: Effort normal and breath sounds normal. She exhibits tenderness (right side).  Abdominal: There is no tenderness.  Musculoskeletal: Normal range of motion.  Neurological: She is alert.  Radial pulses 2+, grips are equal.  Skin: Skin is warm and dry.  Psychiatric: She has a normal mood and affect.  Nursing note and vitals reviewed.    ED Treatments / Results  Labs (all labs ordered are listed, but only abnormal results are  displayed) Labs Reviewed - No data to display  Radiology No results found.  Procedures Procedures (including critical care time)  Medications Ordered in ED Medications  cyclobenzaprine (FLEXERIL) tablet 10 mg (10 mg Oral Given 10/15/17 1903)  ondansetron (ZOFRAN-ODT) disintegrating tablet 4 mg (4 mg Oral Given 10/15/17 1902)  ketorolac (TORADOL) injection 30 mg (30 mg Intramuscular Given 10/15/17 1903)  oxyCODONE-acetaminophen (PERCOCET/ROXICET) 5-325 MG per tablet 1 tablet (1 tablet Oral  Given 10/15/17 2102)     Initial Impression / Assessment and Plan / ED Course  I have reviewed the triage vital signs and the nursing notes. 46 y.o. female with right side chest wall pain after lifting and twisting yesterday. Pain improved with treatment in the ED. Stable for d/c without cardiac symptoms.  Final Clinical Impressions(s) / ED Diagnoses   Final diagnoses:  Right-sided chest wall pain    New Prescriptions Discharge Medication List as of 10/15/2017 10:38 PM    START taking these medications   Details  cyclobenzaprine (FLEXERIL) 10 MG tablet Take 1 tablet (10 mg total) by mouth 2 (two) times daily as needed for muscle spasms., Starting Wed 10/15/2017, Print    predniSONE (DELTASONE) 50 MG tablet Take one tablet daily., Print         Debroah Baller [place], [place] 10/15/17 2307    Drenda Freeze, MD 10/15/17 [phone]

## 2017-11-10 ENCOUNTER — Encounter (HOSPITAL_COMMUNITY): Payer: Self-pay | Admitting: Emergency Medicine

## 2017-11-10 ENCOUNTER — Ambulatory Visit (HOSPITAL_COMMUNITY)
Admission: EM | Admit: 2017-11-10 | Discharge: 2017-11-10 | Disposition: A | Discharge status: Home / Self Care | Payer: PRIVATE HEALTH INSURANCE | Attending: Emergency Medicine | Admitting: Emergency Medicine

## 2017-11-10 DIAGNOSIS — R0789 Other chest pain: Secondary | ICD-10-CM | POA: Diagnosis not present

## 2017-11-10 MED ORDER — CYCLOBENZAPRINE HCL 10 MG PO TABS: 10.0000 mg | ORAL_TABLET | Freq: Two times a day (BID) | ORAL | 0 refills | Status: AC | PRN

## 2017-11-10 NOTE — Discharge Instructions (Signed)
1. Please take Ibuprofen 600-800 mg every 8 hours for 3 days.  2. Take Flexeril 1 tablet twice a day as needed.  3. Light duty at work for 2 weeks.   4. Follow up with primary doctor if symptoms fail to improve.

## 2017-11-10 NOTE — ED Provider Notes (Signed)
[facility]    CSN: [id number] Arrival date & time: 11/10/17  1113     History   Chief Complaint Chief Complaint  Patient presents with  . Muscle Pain    HPI Leah Lane is a 46 y.o. female coming in for evaluation of right chest pain. She injured her chest initially at the beginning of November. She was changing a tire and felt a strain in her right chest immediately after. She went to the ED and was given flexeril and prednisone. 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 The flexeril helped take the edge off her pain, but her pain never improved. Her pain worsens with talking and breathing; also with overhead and lifting motions. Pain is constant and throbbing. Denies cough, shortness of breath, fever. Patient is a smoker. No history of diabetes.  HPI  History reviewed. No pertinent past medical history.  There are no active problems to display for this patient.   Past Surgical History:  Procedure Laterality Date  . HERNIA REPAIR      OB History    No data available       Home Medications    Prior to Admission medications   Medication Sig Start Date End Date Taking? Authorizing Provider  cyclobenzaprine (FLEXERIL) 10 MG tablet Take 1 tablet (10 mg total) by mouth 2 (two) times daily as needed for up to 12 days (Muscular Chest Pain). 11/10/17 11/22/17  Mychael Soots C, PA-C  naproxen sodium (ALEVE) 220 MG tablet Take 440 mg by mouth daily as needed (pain).     [provider]  predniSONE (DELTASONE) 50 MG tablet Take one tablet daily. 10/15/17   Ashley Murrain, NP    Family History History reviewed. No pertinent family history.  Social History Social History   Tobacco Use  . Smoking status: Current Every Day Smoker    Packs/day: 0.50    Types: Cigarettes  Substance Use Topics  . Alcohol use: Yes  . Drug use: No     Allergies   Patient has no  known allergies.   Review of Systems Review of Systems  Constitutional: Negative for diaphoresis, fatigue and fever.  HENT: Negative for congestion.   Respiratory: Negative for cough and shortness of breath.   Cardiovascular: Positive for chest pain.  Gastrointestinal: Negative for abdominal pain, nausea and vomiting.  Musculoskeletal: Positive for myalgias. Negative for back pain and neck pain.  Neurological: Negative for dizziness, light-headedness, numbness and headaches.     Physical Exam Triage Vital Signs ED Triage Vitals [11/10/17 1139]  Enc Vitals Group     BP 120/67     Pulse Rate 84     Resp 18     Temp 98.4 F (36.9 C)     Temp Source Oral     SpO2 100 %     Weight      Height      Head Circumference      Peak Flow      Pain Score 8     Pain Loc      Pain Edu?      Excl. in [place]?    No data found.  Updated Vital Signs BP 120/67 (BP Location: Right Arm)   Pulse 84   Temp 98.4 F (36.9 C) (Oral)   Resp 18   SpO2 100%    Physical Exam  Constitutional: She is oriented to person, place, and time. She appears well-developed and well-nourished.  Speaking in soft voice to not aggravate her pain  HENT:  Head: Normocephalic and atraumatic.  Cardiovascular: Normal rate and regular rhythm.  Pulmonary/Chest: Effort normal and breath sounds normal. She has no wheezes. She exhibits tenderness.  Tender to light palpation along right lateral chest wall into right sternal border.   Musculoskeletal:  Tenderness to palpation of right shoulder. No tenderness along, clavicle or scapular spine. Active ROM limited due to pain.   No unilateral calf swelling or erythema.  Neurological: She is alert and oriented to person, place, and time.  Skin: Skin is warm and dry.     UC Treatments / Results  Labs (all labs ordered are listed, but only abnormal results are displayed) Labs Reviewed - No data to display  EKG  EKG Interpretation None       Radiology No  results found.  Procedures Procedures (including critical care time)  Medications Ordered in UC Medications - No data to display   Initial Impression / Assessment and Plan / UC Course  I have reviewed the triage vital signs and the nursing notes.  Pertinent labs & imaging results that were available during my care of the patient were reviewed by me and considered in my medical decision making (see chart for details).    Pain may be from pectoral strain vs costochondritis. Not currently taking any NSAIDs, Advised to take Ibuprofen 600-800mg  every 8 hours with food around the clock for three days, then PRN. Take flexeril twice a day in conjunction. Work note given for light duty for 2 weeks to rest her chest.   Final Clinical Impressions(s) / UC Diagnoses   Final diagnoses:  Right-sided chest wall pain    ED Discharge Orders        Ordered    cyclobenzaprine (FLEXERIL) 10 MG tablet  2 times daily PRN     11/10/17 1230       Controlled Substance Prescriptions [place] Controlled Substance Registry consulted? Not Applicable   Janith Lima, [place] 11/10/17 1251

## 2017-11-10 NOTE — ED Triage Notes (Signed)
Pt sts muscle pain in right chest area after straining to change a tire

## 2017-11-26 ENCOUNTER — Telehealth (HOSPITAL_COMMUNITY): Payer: Self-pay | Admitting: Emergency Medicine

## 2017-11-26 ENCOUNTER — Telehealth (HOSPITAL_COMMUNITY): Payer: Self-pay | Admitting: *Deleted

## 2017-11-26 NOTE — ED Notes (Signed)
Pt walked in on 11-26-2017 stating she is needing a work note stating she can return to work with no restrictions. Spoke with pt to see if she is feeling any better. Per pt she is feeling a whole lot better. Asked Dr Mannie Stabile is we could give pt a letter stating she could give pt a note for work with no restrictions. Per Dr. Mannie Stabile is was fine to provide pt with note if she is indeed feeling better with not restrictions. Letter was produced for pt and given to her and pt showed and verbalized understanding.

## 2017-11-26 NOTE — Telephone Encounter (Signed)
Pt walked in on 11-26-2017 stating she is needing a work note stating she can return to work with no restrictions. Spoke with pt to see if she is feeling any better. Per pt she is feeling a whole lot better. Asked Dr Mannie Stabile is we could give pt a letter stating she could give pt a note for work with no restrictions. Per Dr. Mannie Stabile is was fine to provide pt with note if she is indeed feeling better with not restrictions. Letter was produced for pt and given to her and pt showed and verbalized understanding.    Emilio Aspen

## 2018-05-12 ENCOUNTER — Emergency Department (HOSPITAL_COMMUNITY): Payer: Self-pay

## 2018-05-12 ENCOUNTER — Other Ambulatory Visit: Payer: Self-pay

## 2018-05-12 ENCOUNTER — Emergency Department (HOSPITAL_COMMUNITY)
Admission: EM | Admit: 2018-05-12 | Discharge: 2018-05-12 | Disposition: A | Discharge status: Home / Self Care | Payer: Self-pay | Attending: Emergency Medicine | Admitting: Emergency Medicine

## 2018-05-12 ENCOUNTER — Encounter (HOSPITAL_COMMUNITY): Payer: Self-pay | Admitting: Emergency Medicine

## 2018-05-12 DIAGNOSIS — R252 Cramp and spasm: Secondary | ICD-10-CM

## 2018-05-12 DIAGNOSIS — R0789 Other chest pain: Secondary | ICD-10-CM | POA: Insufficient documentation

## 2018-05-12 DIAGNOSIS — N838 Other noninflammatory disorders of ovary, fallopian tube and broad ligament: Secondary | ICD-10-CM | POA: Insufficient documentation

## 2018-05-12 DIAGNOSIS — F17210 Nicotine dependence, cigarettes, uncomplicated: Secondary | ICD-10-CM | POA: Insufficient documentation

## 2018-05-12 DIAGNOSIS — D75839 Thrombocytosis, unspecified: Secondary | ICD-10-CM

## 2018-05-12 DIAGNOSIS — R079 Chest pain, unspecified: Secondary | ICD-10-CM

## 2018-05-12 DIAGNOSIS — D473 Essential (hemorrhagic) thrombocythemia: Secondary | ICD-10-CM | POA: Insufficient documentation

## 2018-05-12 DIAGNOSIS — N949 Unspecified condition associated with female genital organs and menstrual cycle: Secondary | ICD-10-CM

## 2018-05-12 DIAGNOSIS — D509 Iron deficiency anemia, unspecified: Secondary | ICD-10-CM

## 2018-05-12 LAB — BASIC METABOLIC PANEL
Anion gap: 9 (ref 5–15)
BUN: 8 mg/dL (ref 6–20)
CHLORIDE: 110 mmol/L (ref 101–111)
CO2: 21 mmol/L — ABNORMAL LOW (ref 22–32)
Calcium: 9 mg/dL (ref 8.9–10.3)
Creatinine, Ser: 0.94 mg/dL (ref 0.44–1.00)
GFR calc Af Amer: >60 mL/min (ref >60)
Glucose, Bld: 89 mg/dL (ref 65–99)
POTASSIUM: 4.2 mmol/L (ref 3.5–5.1)
Sodium: 140 mmol/L (ref 135–145)

## 2018-05-12 LAB — CBC
HEMATOCRIT: 27.2 % — AB (ref 36.0–46.0)
Hemoglobin: 8.1 g/dL — ABNORMAL LOW (ref 12.0–15.0)
MCH: 21.3 pg — ABNORMAL LOW (ref 26.0–34.0)
MCHC: 29.8 g/dL — AB (ref 30.0–36.0)
MCV: 71.6 fL — AB (ref 78.0–100.0)
Platelets: 723 10*3/uL — ABNORMAL HIGH (ref 150–400)
RBC: 3.8 MIL/uL — ABNORMAL LOW (ref 3.87–5.11)
RDW: 20.7 % — AB (ref 11.5–15.5)
WBC: 9 10*3/uL (ref 4.0–10.5)

## 2018-05-12 LAB — I-STAT TROPONIN, ED
Troponin i, poc: 0 ng/mL (ref 0.00–0.08)
Troponin i, poc: 0 ng/mL (ref 0.00–0.08)

## 2018-05-12 LAB — I-STAT BETA HCG BLOOD, ED (MC, WL, AP ONLY): I-stat hCG, quantitative: <5 m[IU]/mL (ref <5)

## 2018-05-12 MED ORDER — FERROUS SULFATE 325 (65 FE) MG PO TABS: 325.0000 mg | ORAL_TABLET | Freq: Every day | ORAL | 0 refills | Status: DC

## 2018-05-12 MED ORDER — CYCLOBENZAPRINE HCL 10 MG PO TABS
10.0000 mg | ORAL_TABLET | Freq: Once | ORAL | Status: AC
  Administered 2018-05-12: 10 mg via ORAL
  Filled 2018-05-12: qty 1

## 2018-05-12 MED ORDER — CYCLOBENZAPRINE HCL 5 MG PO TABS: 5.0000 mg | ORAL_TABLET | Freq: Two times a day (BID) | ORAL | 0 refills | Status: DC | PRN

## 2018-05-12 MED ORDER — ACETAMINOPHEN 500 MG PO TABS
1000.0000 mg | ORAL_TABLET | Freq: Once | ORAL | Status: AC
  Administered 2018-05-12: 1000 mg via ORAL
  Filled 2018-05-12: qty 2

## 2018-05-12 MED ORDER — SODIUM CHLORIDE 0.9 % IV BOLUS
500.0000 mL | Freq: Once | INTRAVENOUS | Status: AC
  Administered 2018-05-12: 500 mL via INTRAVENOUS

## 2018-05-12 MED ORDER — KETOROLAC TROMETHAMINE 30 MG/ML IJ SOLN
30.0000 mg | Freq: Once | INTRAMUSCULAR | Status: AC
  Administered 2018-05-12: 30 mg via INTRAVENOUS
  Filled 2018-05-12: qty 1

## 2018-05-12 MED ORDER — IOPAMIDOL (ISOVUE-370) INJECTION 76%
100.0000 mL | Freq: Once | INTRAVENOUS | Status: AC | PRN
  Administered 2018-05-12: 100 mL via INTRAVENOUS

## 2018-05-12 MED ORDER — IOPAMIDOL (ISOVUE-370) INJECTION 76%
INTRAVENOUS | Status: AC
  Filled 2018-05-12: qty 100

## 2018-05-12 NOTE — Discharge Instructions (Addendum)
Please see the information and instructions below regarding your visit.  Your diagnoses today include:  1. Left-sided chest pain   2. Microcytic anemia   3. Muscle cramping   4. Thrombocytosis ([place])   5. Adnexal cyst     Tests performed today include: See side panel of your discharge paperwork for testing performed today. Vital signs are listed at the bottom of these instructions.   Your work-up is reassuring today.  There is no evidence of any tearing of the arteries that leave the heart.  We did find that your hemoglobin which is the oxygen-carrying capacity in the blood is significantly lower than it has been in the past.  This is often related to losing 3 vaginal bleeding, so it is very important to follow-up with OB/GYN.  You also have elevated platelets, which can sometimes happen when your bone marrow is trying to make more products.  Your CT scan shows an incidental finding that you have a cystic structure around the left ovary.  This must be followed up with an ultrasound with OB/GYN.  Medications prescribed:    Take any prescribed medications only as prescribed, and any over the counter medications only as directed on the packaging.  Please take the iron in the morning with orange juice.  You  You are prescribed Flexeril, a muscle relaxant. Some common side effects of this medication include:  Feeling sleepy.  Dizziness. Take care upon going from a seated to a standing position.  Dry mouth.  Feeling tired or weak.  Hard stools (constipation).  Upset stomach. These are not all of the side effects that may occur. If you have questions about side effects, call your doctor. Call your primary care provider for medical advice about side effects.  This medication can be sedating. Only take this medication as needed. Please do not combine with alcohol. Do not drive or operate machinery while taking this medication.   This medication can interact with some other medications.  Make sure to tell any provider you are taking this medication before they prescribe you a new medication.    Home care instructions:  Please follow any educational materials contained in this packet.   Follow-up instructions: Please follow-up with your primary care provider as soon as possible for further evaluation of your symptoms if they are not completely improved.   Please follow up with OBGYN  Return instructions:  Please return to the Emergency Department if you experience worsening symptoms.  Please return to the emergency department if you have any severe chest pain, shortness of breath, severe muscle cramping, weakness in your extremities, or changing sensations in your extremity's. Please return if you have any other emergent concerns.  Additional Information:   Your vital signs today were: BP 113/82    Pulse 78    Temp 98.5 F (36.9 C) (Oral)    Resp 18    LMP 05/12/2018    SpO2 100%  If your blood pressure (BP) was elevated on multiple readings during this visit above 130 for the top number or above 80 for the bottom number, please have this repeated by your primary care provider within one month. --------------  Thank you for allowing [place] to participate in your care today.

## 2018-05-12 NOTE — ED Provider Notes (Signed)
[facility] Provider Note   CSN: [id number] Arrival date & time: 05/12/18  0840     History   Chief Complaint Chief Complaint  Patient presents with  . Chest Pain    HPI Leah Lane is a 47 y.o. female.  HPI  Patient is a 47 year old female with history of anemia (never worked up) presenting for sudden onset left-sided chest pain, and foot cramping.  Patient also noting that she developed left arm and leg heaviness and numbness since this.  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  Patient reported is nonradiating and persistent.  Patient reports at the same time she developed a severe cramp on the bottom of her foot that extended up to the left calf, which is been coming and going, but the chest cramp has been persistent.  Patient reports that it hurts to breathe.  Patient denies any history of DVT/PE, estrogen use, recent immobilization, recent hospitalization, recent surgeries, history of cancer treatment and family history of early MI or vascular disease.  No recent illness, fever, or chills.  Patient is also noting that she has some tingling sensation that feels like absent sensation in the left hand and left foot.  Patient denies weakness, but does report that due to the abnormal sensation her left arm and left leg just do not feel right.  Patient denies visual disturbance, facial weakness, difficulty swallowing or difficulty speaking.  No remedies tried prior to arrival.  History reviewed. No pertinent past medical history.  There are no active problems to display for this patient.   Past Surgical History:  Procedure Laterality Date  . HERNIA REPAIR       OB History   None      Home Medications    Prior to Admission medications   Medication Sig Start Date End Date Taking? Authorizing Provider  ibuprofen  (ADVIL,MOTRIN) 200 MG tablet Take 1,200 mg by mouth as needed.   Yes [provider]  predniSONE (DELTASONE) 50 MG tablet Take one tablet daily. Patient not taking: Reported on 05/12/2018 10/15/17   Ashley Murrain, NP    Family History No family history on file.  Social History Social History   Tobacco Use  . Smoking status: Current Every Day Smoker    Packs/day: 0.50    Types: Cigarettes  Substance Use Topics  . Alcohol use: Yes  . Drug use: No     Allergies   Patient has no known allergies.   Review of Systems Review of Systems  Constitutional: Negative for chills and fever.  HENT: Negative for congestion and rhinorrhea.   Respiratory: Positive for chest tightness. Negative for cough and shortness of breath.   Cardiovascular: Negative for palpitations and leg swelling.  Gastrointestinal: Negative for abdominal pain, nausea and vomiting.  Genitourinary: Negative for difficulty urinating, pelvic pain and urgency.  Musculoskeletal: Positive for myalgias. Negative for back pain.  Skin: Negative for color change.  Neurological: Negative for syncope.       +paresthesias  All other systems reviewed and are negative.    Physical Exam Updated Vital Signs BP 109/64 (BP Location: Left Arm)   Pulse 88   Temp 98.5 F (36.9 C) (Oral)   Resp (!) 22   LMP 05/12/2018   SpO2 100%   Physical Exam  Constitutional: She appears well-developed and well-nourished. No distress.  HENT:  Head: Normocephalic and atraumatic.  Mouth/Throat: Oropharynx is clear and moist.  Eyes: Pupils are equal, round, and reactive to light. Conjunctivae and EOM are normal.  Neck: Normal range of motion. Neck supple.  Cardiovascular: Normal rate, regular rhythm, S1 normal and S2 normal.  No murmur heard. Pulses:      Radial pulses are 2+ on the right side, and 2+ on the left side.       Dorsalis pedis pulses are 2+ on the right side, and 2+ on the left side.       Posterior tibial pulses are  2+ on the right side, and 2+ on the left side.  Pulmonary/Chest: Effort normal and breath sounds normal. She has no wheezes. She has no rales.  Abdominal: Soft. She exhibits no distension. There is no tenderness. There is no guarding.  Musculoskeletal: Normal range of motion. She exhibits no edema or deformity.  Lymphadenopathy:    She has no cervical adenopathy.  Neurological: She is alert.  Strength 4+ out of 5 in left upper extremity with grip strength.  Patient reporting this is due to pain during exam.  Strength 5 out of 5 in right upper externally.  Strength 5 out of 5 in bilateral lower extremity's. Patient able to acknowledge sensation in bilateral upper extremities, but reports paresthesias during exam.  Skin: Skin is warm and dry. No rash noted. No erythema.  Psychiatric: She has a normal mood and affect. Her behavior is normal. Judgment and thought content normal.     ED Treatments / Results  Labs (all labs ordered are listed, but only abnormal results are displayed) Labs Reviewed  BASIC METABOLIC PANEL - Abnormal; Notable for the following components:      Result Value   CO2 21 (*)    All other components within normal limits  CBC - Abnormal; Notable for the following components:   RBC 3.80 (*)    Hemoglobin 8.1 (*)    HCT 27.2 (*)    MCV 71.6 (*)    MCH 21.3 (*)    MCHC 29.8 (*)    RDW 20.7 (*)    Platelets 723 (*)    All other components within normal limits  I-STAT TROPONIN, ED  I-STAT BETA HCG BLOOD, ED ([facility], [facility], [facility] ONLY)    EKG EKG Interpretation  Date/Time:  Tuesday May 12 2018 08:44:14 EDT Ventricular Rate:  99 PR Interval:    QRS Duration: 117 QT Interval:  372 QTC Calculation: 478 R Axis:   64 Text Interpretation:  Sinus rhythm Nonspecific intraventricular conduction delay Artifact Confirmed by Quintella Reichert [phone]) on 05/12/2018 8:58:20 AM   Radiology Dg Chest 2 View  Result Date: 05/12/2018 CLINICAL DATA:  Patient with chest pain. EXAM: CHEST  - 2 VIEW COMPARISON:  Chest radiograph 03/20/2004. FINDINGS: Monitoring leads overlie the patient. Stable cardiac and mediastinal contours. No consolidative pulmonary opacities. No pleural effusion or pneumothorax. Thoracic spine degenerative changes. IMPRESSION: No acute cardiopulmonary process. Electronically Signed   By: Lovey Newcomer M.D.   On: 05/12/2018 09:28   Ct Angio Chest/abd/pel For Dissection W And/or W/wo  Result Date: 05/12/2018 CLINICAL DATA:  Acute onset chest pain this morning. EXAM: CT ANGIOGRAPHY CHEST, ABDOMEN AND PELVIS TECHNIQUE: Multidetector CT imaging through the chest, abdomen and pelvis was performed using the standard protocol during bolus administration of intravenous contrast. Multiplanar reconstructed images and MIPs were obtained and reviewed to evaluate the vascular anatomy. CONTRAST:  100 ml ISOVUE-370 IOPAMIDOL (ISOVUE-370) INJECTION 76% COMPARISON:  CT abdomen  and pelvis 03/26/2014. FINDINGS: CTA CHEST FINDINGS Cardiovascular: Preferential opacification of the thoracic aorta. No evidence of thoracic aortic aneurysm or dissection. Normal heart size. No pericardial effusion. A few small atherosclerotic calcifications of the coronary arteries noted. Mediastinum/Nodes: No enlarged mediastinal, hilar, or axillary lymph nodes. Thyroid gland, trachea, and esophagus demonstrate no significant findings. Lungs/Pleura: Lungs are clear. No pleural effusion or pneumothorax. Musculoskeletal: Negative. Review of the MIP images confirms the above findings. CTA ABDOMEN AND PELVIS FINDINGS VASCULAR Aorta: Normal caliber aorta without aneurysm, dissection, vasculitis or significant stenosis. Scattered atherosclerotic calcifications are noted. Celiac: Patent without evidence of aneurysm, dissection, vasculitis or significant stenosis. SMA: Patent without evidence of aneurysm, dissection, vasculitis or significant stenosis. Renals: Both renal arteries are patent without evidence of aneurysm,  dissection, vasculitis, fibromuscular dysplasia or significant stenosis. IMA: Patent without evidence of aneurysm, dissection, vasculitis or significant stenosis. Inflow: Patent without evidence of aneurysm, dissection, vasculitis or significant stenosis. Veins: No obvious venous abnormality within the limitations of this arterial phase study. Review of the MIP images confirms the above findings. NON-VASCULAR Hepatobiliary: No focal liver abnormality is seen. No gallstones, gallbladder wall thickening, or biliary dilatation. Pancreas: Unremarkable. No pancreatic ductal dilatation or surrounding inflammatory changes. Spleen: Normal in size without focal abnormality. Adrenals/Urinary Tract: Adrenal glands are unremarkable. Kidneys are normal, without renal calculi, focal lesion, or hydronephrosis. Bladder is unremarkable. Stomach/Bowel: Stomach is within normal limits. Appendix appears normal. No evidence of bowel wall thickening, distention, or inflammatory changes. Lymphatic: Negative. Reproductive: Bulky fibroid uterus is identified as seen on the prior CT scan. The patient has a cystic lesion in the left adnexa measuring 6.1 cm craniocaudal by 3.9 cm transverse 6.3 cm AP which is new since the prior CT. Right adnexa is unremarkable. Other: No fluid collection. The patient appears to be status post repair of an umbilical hernia. Musculoskeletal: Negative. Review of the MIP images confirms the above findings. IMPRESSION: Negative for aortic dissection or aneurysm.  No acute finding. Cystic lesion in the left adnexa measuring 6.4 cm in diameter cannot be definitively characterized. Pelvic ultrasound is recommended for further evaluation. This recommendation follows ACR consensus guidelines: White Paper of the ACR Incidental Findings Committee II on Adnexal Findings. J Am Coll Radiol [phone]. Bulky fibroid uterus. Calcific aortic and coronary atherosclerosis. Electronically Signed   By: Inge Rise M.D.    On: 05/12/2018 13:17    Procedures Procedures (including critical care time)  Medications Ordered in ED Medications  cyclobenzaprine (FLEXERIL) tablet 10 mg (has no administration in time range)     Initial Impression / Assessment and Plan / ED Course  I have reviewed the triage vital signs and the nursing notes.  Pertinent labs & imaging results that were available during my care of the patient were reviewed by me and considered in my medical decision making (see chart for details).  Clinical Course as of May 12 1921  Tue May 12, 2018  0928 Hemoglobin noted to be 8.1 today, microcytic.  It is never been this on the past.  Patient also has a history of thrombocytosis, but not as elevated as 723.  May be reactive.   [AM]  1020 Case discussed with Dr. Ralene Bathe, who will evaluate patient.   [AM]    Clinical Course User Index [AM] Albesa Seen, PA-C    Patient nontoxic-appearing and in no acute distress.  Differential diagnosis includes ACS, thoracic aortic dissection, pulmonary embolism, chest wall pain, muscle cramping, electrolyte disturbance causing muscle cramping.  Work-up significant for  microcytic anemia of 8.1.  Patient does not have any risk factors requiring acute transfusion of this.  Additionally, patient found to have thrombocytosis, likely reactive as patient has exhibited thrombocytosis prior with low hemoglobin values.  Patient does report having heavy vaginal bleeding multiple times a month that she describes as periods patient reports that she can have repetitive bleeding up to.  3 times a month.  Patient reports she is previously diagnosed with fibroids but has not followed up with an OB/GYN for this.    Delta troponin negative.  EKG demonstrates no evidence of ischemia, infarction, or arrhythmia.  Due to severe chest pain and neurologic symptoms, CT angios of the aorta ordered, which showed no evidence of dissection, but did incidentally note a left adnexal cystic  structure.  Ultrasound was recommended, and this was placed in discharge diagnosis and patient was also given the report of her CT scan and informed that she must follow-up with OB/GYN outpatient.  As patient is having no pelvic symptoms, no indication to do this emergently today.  Patient was also instructed to follow-up with OB/GYN regarding heavy vaginal bleeding, as this is the most likely cause of her anemia.  Patient denying other blood loss.  Incidental finding of atherosclerosis also noted patient, and encouraged PCP follow-up for cardiac risk stratification.  Pain controlled emergency department with muscle relaxants, acetaminophen, and Toradol.  Patient can return precautions for any worsening chest pain or shortness of breath, or new or worsening neurologic symptoms.  Patient is in understanding and agrees with the plan of care.  This is a shared visit with Dr. Quintella Reichert. Patient was independently evaluated by this attending physician. Attending physician consulted in evaluation and discharge management.  Final Clinical Impressions(s) / ED Diagnoses   Final diagnoses:  Left-sided chest pain  Microcytic anemia  Muscle cramping  Thrombocytosis (HCC)  Adnexal cyst    ED Discharge Orders        Ordered    ferrous sulfate 325 (65 FE) MG tablet  Daily     05/12/18 1446    cyclobenzaprine (FLEXERIL) 5 MG tablet  2 times daily PRN     05/12/18 1446       Tamala Julian 05/12/18 1930    Quintella Reichert, MD 05/14/18 1054

## 2018-05-12 NOTE — ED Triage Notes (Signed)
Patient brought in by [facility] from work for complaint of chest pain. Patient is a Clinical research associate at [employer] and states she suddenly started feeling a cramping in her left chest and LUQ abdomen. During triage patient states her left foot also started cramping. Denies shortness of breath, nausea. Alert and oriented and in no apparent distress at this time.

## 2018-05-12 NOTE — ED Notes (Signed)
Pt returned from imaging.

## 2019-03-08 ENCOUNTER — Ambulatory Visit (HOSPITAL_COMMUNITY)
Admission: EM | Admit: 2019-03-08 | Discharge: 2019-03-08 | Disposition: A | Discharge status: Home / Self Care | Payer: Self-pay | Attending: Family Medicine | Admitting: Family Medicine

## 2019-03-08 ENCOUNTER — Other Ambulatory Visit: Payer: Self-pay

## 2019-03-08 ENCOUNTER — Encounter (HOSPITAL_COMMUNITY): Payer: Self-pay

## 2019-03-08 ENCOUNTER — Ambulatory Visit (INDEPENDENT_AMBULATORY_CARE_PROVIDER_SITE_OTHER): Payer: Self-pay

## 2019-03-08 DIAGNOSIS — R05 Cough: Secondary | ICD-10-CM

## 2019-03-08 DIAGNOSIS — R69 Illness, unspecified: Secondary | ICD-10-CM

## 2019-03-08 DIAGNOSIS — J111 Influenza due to unidentified influenza virus with other respiratory manifestations: Secondary | ICD-10-CM

## 2019-03-08 DIAGNOSIS — R0789 Other chest pain: Secondary | ICD-10-CM

## 2019-03-08 MED ORDER — ONDANSETRON 4 MG PO TBDP: 4.0000 mg | ORAL_TABLET | Freq: Three times a day (TID) | ORAL | 0 refills | Status: DC | PRN

## 2019-03-08 MED ORDER — IBUPROFEN 800 MG PO TABS: 800.0000 mg | ORAL_TABLET | Freq: Three times a day (TID) | ORAL | 0 refills | Status: DC

## 2019-03-08 MED ORDER — OSELTAMIVIR PHOSPHATE 75 MG PO CAPS: 75.0000 mg | ORAL_CAPSULE | Freq: Two times a day (BID) | ORAL | 0 refills | Status: DC

## 2019-03-08 NOTE — ED Provider Notes (Signed)
[facility]    CSN: [id number] Arrival date & time: 03/08/19  1002     History   Chief Complaint Chief Complaint  Patient presents with  . Influenza    HPI Leah Lane is a 48 y.o. female.   Patient is a 48 year old female who presents with cough, chest pain, shortness of breath, fever, body aches. Coughing up phlegm. This all started approximately 3 days ago.  Her symptoms have been constant and worsening over the past day. Developed fever today.  She has also had some nausea and vomiting.  Denies any recent sick contacts.  Denies any recent traveling.she has not taken anything for her symptoms. No sore throat, ear pain, nasal congestion or rhinorrhea. Current everyday smoker.   ROS per HPI    Influenza    History reviewed. No pertinent past medical history.  There are no active problems to display for this patient.   Past Surgical History:  Procedure Laterality Date  . HERNIA REPAIR      OB History   No obstetric history on file.      Home Medications    Prior to Admission medications   Medication Sig Start Date End Date Taking? Authorizing Provider  cyclobenzaprine (FLEXERIL) 5 MG tablet Take 1 tablet (5 mg total) by mouth 2 (two) times daily as needed for muscle spasms. 05/12/18   Langston Masker B, PA-C  ferrous sulfate 325 (65 FE) MG tablet Take 1 tablet (325 mg total) by mouth daily. 05/12/18   Langston Masker B, PA-C  ibuprofen (ADVIL,MOTRIN) 200 MG tablet Take 1,200 mg by mouth as needed.    [provider]  ibuprofen (ADVIL,MOTRIN) 800 MG tablet Take 1 tablet (800 mg total) by mouth 3 (three) times daily. 03/08/19   Loura Halt A, NP  ondansetron (ZOFRAN ODT) 4 MG disintegrating tablet Take 1 tablet (4 mg total) by mouth every 8 (eight) hours as needed for nausea or vomiting. 03/08/19   Loura Halt A, NP  oseltamivir (TAMIFLU) 75 MG capsule Take 1 capsule (75 mg total) by mouth every 12 (twelve) hours. 03/08/19   Orvan July, NP     Family History Family History  Problem Relation Age of Onset  . Healthy Mother   . Healthy Father     Social History Social History   Tobacco Use  . Smoking status: Current Every Day Smoker    Packs/day: 0.50    Types: Cigarettes  . Smokeless tobacco: Never Used  Substance Use Topics  . Alcohol use: Yes  . Drug use: No     Allergies   Patient has no known allergies.   Review of Systems Review of Systems   Physical Exam Triage Vital Signs ED Triage Vitals  Enc Vitals Group     BP 03/08/19 1018 115/72     Pulse Rate 03/08/19 1018 (!) 101     Resp 03/08/19 1018 18     Temp 03/08/19 1018 99.2 F (37.3 C)     Temp Source 03/08/19 1018 Oral     SpO2 03/08/19 1018 100 %     Weight 03/08/19 1016 135 lb (61.2 kg)     Height --      Head Circumference --      Peak Flow --      Pain Score 03/08/19 1016 8     Pain Loc --      Pain Edu? --      Excl. in [place]? --    No data  found.  Updated Vital Signs BP 115/72 (BP Location: Right Arm)   Pulse (!) 101   Temp 99.2 F (37.3 C) (Oral)   Resp 18   Wt 135 lb (61.2 kg)   LMP 03/02/2019 (Exact Date)   SpO2 100%   Visual Acuity Right Eye Distance:   Left Eye Distance:   Bilateral Distance:    Right Eye Near:   Left Eye Near:    Bilateral Near:     Physical Exam Vitals signs and nursing note reviewed.  Constitutional:      Appearance: She is ill-appearing. She is not toxic-appearing.  HENT:     Head: Normocephalic and atraumatic.     Right Ear: Tympanic membrane and ear canal normal.     Left Ear: Tympanic membrane and ear canal normal.     Nose: Nose normal.     Mouth/Throat:     Pharynx: Oropharynx is clear.  Eyes:     Conjunctiva/sclera: Conjunctivae normal.  Neck:     Musculoskeletal: Normal range of motion.  Cardiovascular:     Rate and Rhythm: Normal rate and regular rhythm.     Pulses: Normal pulses.     Heart sounds: Normal heart sounds.  Pulmonary:     Effort: Pulmonary effort is normal.      Breath sounds: Normal breath sounds.     Comments: Mild dyspnea with rest.  Musculoskeletal: Normal range of motion.  Skin:    General: Skin is warm and dry.  Neurological:     Mental Status: She is alert.  Psychiatric:        Mood and Affect: Mood normal.      UC Treatments / Results  Labs (all labs ordered are listed, but only abnormal results are displayed) Labs Reviewed - No data to display  EKG None  Radiology Dg Chest 2 View  Result Date: 03/08/2019 CLINICAL DATA:  Cough, chest pain, low-grade fever for 3 days EXAM: CHEST - 2 VIEW COMPARISON:  05/12/2018 FINDINGS: The heart size and mediastinal contours are within normal limits. Both lungs are clear. The visualized skeletal structures are unremarkable. IMPRESSION: No active cardiopulmonary disease. Electronically Signed   By: Jerilynn Mages.  Shick M.D.   On: 03/08/2019 11:22    Procedures Procedures (including critical care time)  Medications Ordered in UC Medications - No data to display  Initial Impression / Assessment and Plan / UC Course  I have reviewed the triage vital signs and the nursing notes.  Pertinent labs & imaging results that were available during my care of the patient were reviewed by me and considered in my medical decision making (see chart for details).     Symptoms consistent with flu like illness She is nontoxic and was less dyspneic upon reassessment. Lungs clear, vital signs stable No concern for PE Chest x-ray negative Tamiflu twice a day for 5 days Symptomatic treatment with OTC cough and congestion medication such as mucinex Tylenol/ibuprofen for the myalgias and fever.  Zofran as needed for nausea, vomiting Follow up as needed for continued or worsening symptoms  Final Clinical Impressions(s) / UC Diagnoses   Final diagnoses:  Influenza-like illness     Discharge Instructions     Your x ray was normal We are treating you for influenza like illness Tamiflu twice a day for 5 days.   Zofran as needed for nausea, vomiting Ibuprofen 800 mg every 8 hours as needed for body aches and fever  Rest and stay hydrated.  If you become more short  of breath or have any other worsening symptoms you need to go to the ER      ED Prescriptions    Medication Sig Dispense Auth. Provider   oseltamivir (TAMIFLU) 75 MG capsule Take 1 capsule (75 mg total) by mouth every 12 (twelve) hours. 10 capsule Amilah Greenspan A, NP   ibuprofen (ADVIL,MOTRIN) 800 MG tablet Take 1 tablet (800 mg total) by mouth 3 (three) times daily. 21 tablet Shamir Tuzzolino A, NP   ondansetron (ZOFRAN ODT) 4 MG disintegrating tablet Take 1 tablet (4 mg total) by mouth every 8 (eight) hours as needed for nausea or vomiting. 20 tablet Loura Halt A, NP     Controlled Substance Prescriptions [place] Controlled Substance Registry consulted? Not Applicable   Orvan July, NP 03/08/19 1314

## 2019-03-08 NOTE — ED Triage Notes (Signed)
Pt cc vomiting, coughing,body aches and abdominal pain and SOB x 3 days.

## 2019-03-08 NOTE — Discharge Instructions (Signed)
Your x ray was normal We are treating you for influenza like illness Tamiflu twice a day for 5 days.  Zofran as needed for nausea, vomiting Ibuprofen 800 mg every 8 hours as needed for body aches and fever  Rest and stay hydrated.  If you become more short of breath or have any other worsening symptoms you need to go to the ER

## 2019-03-08 NOTE — ED Notes (Signed)
Patient verbalizes understanding of discharge instructions. Opportunity for questioning and answers were provided. Patient discharged from [facility] by NP.  

## 2021-07-31 ENCOUNTER — Encounter (HOSPITAL_COMMUNITY): Payer: Self-pay

## 2021-07-31 ENCOUNTER — Ambulatory Visit (INDEPENDENT_AMBULATORY_CARE_PROVIDER_SITE_OTHER): Payer: BC Managed Care – PPO

## 2021-07-31 ENCOUNTER — Other Ambulatory Visit: Payer: Self-pay

## 2021-07-31 ENCOUNTER — Ambulatory Visit (HOSPITAL_COMMUNITY)
Admission: EM | Admit: 2021-07-31 | Discharge: 2021-07-31 | Disposition: A | Discharge status: Home / Self Care | Payer: BC Managed Care – PPO | Attending: Emergency Medicine | Admitting: Emergency Medicine

## 2021-07-31 DIAGNOSIS — R059 Cough, unspecified: Secondary | ICD-10-CM

## 2021-07-31 DIAGNOSIS — R062 Wheezing: Secondary | ICD-10-CM

## 2021-07-31 DIAGNOSIS — R52 Pain, unspecified: Secondary | ICD-10-CM | POA: Insufficient documentation

## 2021-07-31 DIAGNOSIS — U071 COVID-19: Secondary | ICD-10-CM | POA: Insufficient documentation

## 2021-07-31 DIAGNOSIS — R112 Nausea with vomiting, unspecified: Secondary | ICD-10-CM | POA: Diagnosis not present

## 2021-07-31 DIAGNOSIS — R519 Headache, unspecified: Secondary | ICD-10-CM

## 2021-07-31 DIAGNOSIS — R0602 Shortness of breath: Secondary | ICD-10-CM | POA: Diagnosis not present

## 2021-07-31 DIAGNOSIS — R509 Fever, unspecified: Secondary | ICD-10-CM | POA: Insufficient documentation

## 2021-07-31 LAB — SARS CORONAVIRUS 2 (TAT 6-24 HRS): SARS Coronavirus 2: POSITIVE — AB

## 2021-07-31 MED ORDER — IBUPROFEN 800 MG PO TABS
ORAL_TABLET | ORAL | Status: AC
  Filled 2021-07-31: qty 1

## 2021-07-31 MED ORDER — PREDNISONE 10 MG (21) PO TBPK: ORAL_TABLET | Freq: Every day | ORAL | 0 refills | Status: DC

## 2021-07-31 MED ORDER — IBUPROFEN 800 MG PO TABS
800.0000 mg | ORAL_TABLET | Freq: Once | ORAL | Status: AC
  Administered 2021-07-31: 800 mg via ORAL

## 2021-07-31 MED ORDER — MOLNUPIRAVIR EUA 200MG CAPSULE: 4.0000 | ORAL_CAPSULE | Freq: Two times a day (BID) | ORAL | 0 refills | Status: AC

## 2021-07-31 NOTE — Discharge Instructions (Addendum)
Stay hydrated well  Avoid contact for a minimum 5 days  Take motrin or tylenol as needed for fever  GO to the er if symptoms become worse

## 2021-07-31 NOTE — ED Provider Notes (Signed)
[place]    CSN: [id number] Arrival date & time: 07/31/21  1339      History   Chief Complaint Chief Complaint  Patient presents with   Headache    HPI Leah Lane is a 50 y.o. female.   Pt brought in by daughter for fever, cough, congestion, chills, body aches, n/v and swelling of hands. She works at [employer] and is unsure what she has been exposed to. Took Theraflu earlier with no relief. Not able to eat or drink much.    History reviewed. No pertinent past medical history.  There are no problems to display for this patient.   Past Surgical History:  Procedure Laterality Date   HERNIA REPAIR      OB History   No obstetric history on file.      Home Medications    Prior to Admission medications   Medication Sig Start Date End Date Taking? Authorizing Provider  molnupiravir EUA 200 mg CAPS Take 4 capsules (800 mg total) by mouth 2 (two) times daily for 5 days. 07/31/21 08/05/21 Yes Marney Setting, NP  predniSONE (STERAPRED UNI-PAK 21 TAB) 10 MG (21) TBPK tablet Take by mouth daily. Take 6 tabs by mouth daily  for 2 days, then 5 tabs for 2 days, then 4 tabs for 2 days, then 3 tabs for 2 days, 2 tabs for 2 days, then 1 tab by mouth daily for 2 days 07/31/21  Yes Marney Setting, NP  cyclobenzaprine (FLEXERIL) 5 MG tablet Take 1 tablet (5 mg total) by mouth 2 (two) times daily as needed for muscle spasms. 05/12/18   Langston Masker B, PA-C  ferrous sulfate 325 (65 FE) MG tablet Take 1 tablet (325 mg total) by mouth daily. 05/12/18   Langston Masker B, PA-C  ibuprofen (ADVIL,MOTRIN) 200 MG tablet Take 1,200 mg by mouth as needed.    [provider]  ibuprofen (ADVIL,MOTRIN) 800 MG tablet Take 1 tablet (800 mg total) by mouth 3 (three) times daily. 03/08/19   Loura Halt A, NP  ondansetron (ZOFRAN ODT) 4 MG disintegrating tablet Take 1 tablet (4 mg total) by mouth every 8 (eight) hours as needed for nausea or vomiting. 03/08/19   Loura Halt A, NP   oseltamivir (TAMIFLU) 75 MG capsule Take 1 capsule (75 mg total) by mouth every 12 (twelve) hours. 03/08/19   Orvan July, NP    Family History Family History  Problem Relation Age of Onset   Healthy Mother    Healthy Father     Social History Social History   Tobacco Use   Smoking status: Every Day    Packs/day: 0.50    Types: Cigarettes   Smokeless tobacco: Never  Substance Use Topics   Alcohol use: Yes   Drug use: No     Allergies   Patient has no known allergies.   Review of Systems Review of Systems  Constitutional:  Positive for activity change, chills, fatigue and fever.  HENT:  Positive for congestion, postnasal drip, rhinorrhea, sinus pressure, sinus pain and sneezing.   Eyes: Negative.   Respiratory:  Positive for cough and shortness of breath.   Cardiovascular: Negative.   Gastrointestinal:  Positive for nausea and vomiting.  Genitourinary: Negative.   Musculoskeletal:        Generalized body pain   Skin:        Hot to touch   Neurological:  Positive for weakness and headaches.    Physical Exam Triage Vital  Signs ED Triage Vitals  Enc Vitals Group     BP 07/31/21 1453 119/75     Pulse Rate 07/31/21 1453 82     Resp 07/31/21 1453 18     Temp 07/31/21 1453 (!) 101.1 F (38.4 C)     Temp Source 07/31/21 1453 Oral     SpO2 07/31/21 1453 98 %     Weight --      Height --      Head Circumference --      Peak Flow --      Pain Score 07/31/21 1455 10     Pain Loc --      Pain Edu? --      Excl. in [place]? --    No data found.  Updated Vital Signs BP 119/75 (BP Location: Right Arm)   Pulse 82   Temp (!) 101.1 F (38.4 C) (Oral)   Resp 18   SpO2 98%   Visual Acuity Right Eye Distance:   Left Eye Distance:   Bilateral Distance:    Right Eye Near:   Left Eye Near:    Bilateral Near:     Physical Exam Constitutional:      Appearance: She is ill-appearing.  HENT:     Mouth/Throat:     Mouth: Mucous membranes are moist.   Cardiovascular:     Rate and Rhythm: Normal rate.  Pulmonary:     Breath sounds: Wheezing present.  Abdominal:     Palpations: Abdomen is soft.  Musculoskeletal:        General: Normal range of motion.     Cervical back: Normal range of motion.  Skin:    General: Skin is warm.  Neurological:     Mental Status: She is alert.     GCS: GCS eye subscore is 4. GCS verbal subscore is 5. GCS motor subscore is 6.     UC Treatments / Results  Labs (all labs ordered are listed, but only abnormal results are displayed) Labs Reviewed  SARS CORONAVIRUS 2 (TAT 6-24 HRS)    EKG   Radiology DG Chest 2 View  Result Date: 07/31/2021 CLINICAL DATA:  Shortness of breath, cough, wheezing and fever in a 50 year old female. EXAM: CHEST - 2 VIEW COMPARISON:  March 08, 2019. FINDINGS: Is trachea midline. Cardiomediastinal contours and hilar structures are normal. Lungs are clear. No sign of pneumothorax, effusion or consolidation. On limited assessment no acute skeletal process. IMPRESSION: No acute cardiopulmonary disease. Electronically Signed   By: Zetta Bills M.D.   On: 07/31/2021 15:46    Procedures Procedures (including critical care time)  Medications Ordered in UC Medications  ibuprofen (ADVIL) tablet 800 mg (800 mg Oral Given 07/31/21 1509)    Initial Impression / Assessment and Plan / UC Course  I have reviewed the triage vital signs and the nursing notes.  Pertinent labs & imaging results that were available during my care of the patient were reviewed by me and considered in my medical decision making (see chart for details).    Stay hydrated well  Avoid contact for a minimum 5 days  Take motrin or tylenol as needed for fever  GO to the er if symptoms become worse  Check my chart for results of test    Final Clinical Impressions(s) / UC Diagnoses   Final diagnoses:  Acute nonintractable headache, unspecified headache type  Fever, unspecified  Body aches  Nausea and  vomiting, intractability of vomiting not specified, unspecified vomiting type  Discharge Instructions      Stay hydrated well  Avoid contact for a minimum 5 days  Take motrin or tylenol as needed for fever  GO to the er if symptoms become worse      ED Prescriptions     Medication Sig Dispense Auth. Provider   predniSONE (STERAPRED UNI-PAK 21 TAB) 10 MG (21) TBPK tablet Take by mouth daily. Take 6 tabs by mouth daily  for 2 days, then 5 tabs for 2 days, then 4 tabs for 2 days, then 3 tabs for 2 days, 2 tabs for 2 days, then 1 tab by mouth daily for 2 days 42 tablet Morley Kos L, NP   molnupiravir EUA 200 mg CAPS Take 4 capsules (800 mg total) by mouth 2 (two) times daily for 5 days. 40 capsule Marney Setting, NP      PDMP not reviewed this encounter.   Marney Setting, NP 07/31/21 1606

## 2021-07-31 NOTE — ED Triage Notes (Signed)
Pt c/o headache with associated dizziness, fever, N&V, body chills and aches. Denies diarrhea or constipation. Onset last night. States tried theraflu at home without relief.

## 2022-01-21 ENCOUNTER — Ambulatory Visit (INDEPENDENT_AMBULATORY_CARE_PROVIDER_SITE_OTHER): Payer: BC Managed Care – PPO

## 2022-01-21 ENCOUNTER — Other Ambulatory Visit: Payer: Self-pay

## 2022-01-21 ENCOUNTER — Encounter (HOSPITAL_COMMUNITY): Payer: Self-pay | Admitting: Emergency Medicine

## 2022-01-21 ENCOUNTER — Ambulatory Visit (HOSPITAL_COMMUNITY)
Admission: EM | Admit: 2022-01-21 | Discharge: 2022-01-21 | Disposition: A | Discharge status: Home / Self Care | Payer: BC Managed Care – PPO | Attending: Family Medicine | Admitting: Family Medicine

## 2022-01-21 DIAGNOSIS — R52 Pain, unspecified: Secondary | ICD-10-CM | POA: Diagnosis not present

## 2022-01-21 DIAGNOSIS — B349 Viral infection, unspecified: Secondary | ICD-10-CM | POA: Insufficient documentation

## 2022-01-21 DIAGNOSIS — Z20822 Contact with and (suspected) exposure to covid-19: Secondary | ICD-10-CM | POA: Insufficient documentation

## 2022-01-21 DIAGNOSIS — R0600 Dyspnea, unspecified: Secondary | ICD-10-CM | POA: Diagnosis not present

## 2022-01-21 MED ORDER — IBUPROFEN 800 MG PO TABS: 800.0000 mg | ORAL_TABLET | Freq: Three times a day (TID) | ORAL | 0 refills | Status: DC

## 2022-01-21 NOTE — ED Triage Notes (Addendum)
Patient complains of body aches, body aches started Friday.  Denies cold symptoms.  Unknown fever.  Raspy voice, no sore throat.

## 2022-01-21 NOTE — ED Provider Notes (Addendum)
[place]    CSN: [id number] Arrival date & time: 01/21/22  1527      History   Chief Complaint Chief Complaint  Patient presents with   Generalized Body Aches    HPI Leah Lane is a 51 y.o. female.   HPI Here for history of myalgias since February 3.  She is now started having a little rhinorrhea today.  She also is very tired and feels short of breath.  No cough or sore throat noted.  No vomiting or diarrhea  Past medical history is negative  No fever noted  History reviewed. No pertinent past medical history.  There are no problems to display for this patient.   Past Surgical History:  Procedure Laterality Date   HERNIA REPAIR      OB History   No obstetric history on file.      Home Medications    Prior to Admission medications   Medication Sig Start Date End Date Taking? Authorizing Provider  ibuprofen (ADVIL) 800 MG tablet Take 1 tablet (800 mg total) by mouth 3 (three) times daily. 01/21/22   Barrett Henle, MD    Family History Family History  Problem Relation Age of Onset   Healthy Mother    Healthy Father     Social History Social History   Tobacco Use   Smoking status: Every Day    Packs/day: 0.50    Types: Cigarettes   Smokeless tobacco: Never  Vaping Use   Vaping Use: Never used  Substance Use Topics   Alcohol use: Yes   Drug use: Yes    Types: Marijuana     Allergies   Patient has no known allergies.   Review of Systems Review of Systems   Physical Exam Triage Vital Signs ED Triage Vitals  Enc Vitals Group     BP 01/21/22 1728 (!) 142/91     Pulse Rate 01/21/22 1728 94     Resp 01/21/22 1728 18     Temp 01/21/22 1728 98.9 F (37.2 C)     Temp Source 01/21/22 1728 Oral     SpO2 01/21/22 1728 99 %     Weight --      Height --      Head Circumference --      Peak Flow --      Pain Score 01/21/22 1724 7     Pain Loc --      Pain Edu? --      Excl. in [place]? --    No data found.  Updated Vital  Signs BP (!) 142/91 (BP Location: Right Arm)    Pulse 94    Temp 98.9 F (37.2 C) (Oral)    Resp 18    LMP 01/21/2022    SpO2 99%   Visual Acuity Right Eye Distance:   Left Eye Distance:   Bilateral Distance:    Right Eye Near:   Left Eye Near:    Bilateral Near:     Physical Exam Vitals reviewed.  Constitutional:      General: She is not in acute distress.    Appearance: She is not toxic-appearing.  HENT:     Right Ear: Tympanic membrane and ear canal normal.     Left Ear: Tympanic membrane and ear canal normal.     Nose: Nose normal.     Mouth/Throat:     Mouth: Mucous membranes are moist.     Pharynx: No oropharyngeal exudate or posterior oropharyngeal erythema.  Eyes:     Extraocular Movements: Extraocular movements intact.     Conjunctiva/sclera: Conjunctivae normal.     Pupils: Pupils are equal, round, and reactive to light.  Cardiovascular:     Rate and Rhythm: Normal rate and regular rhythm.     Heart sounds: No murmur heard. Pulmonary:     Effort: Pulmonary effort is normal. No respiratory distress.     Breath sounds: No wheezing, rhonchi or rales.  Chest:     Chest wall: No tenderness.  Musculoskeletal:     Cervical back: Neck supple.  Lymphadenopathy:     Cervical: No cervical adenopathy.  Skin:    Capillary Refill: Capillary refill takes less than 2 seconds.     Coloration: Skin is not jaundiced or pale.  Neurological:     General: No focal deficit present.     Mental Status: She is alert and oriented to person, place, and time.  Psychiatric:        Behavior: Behavior normal.     UC Treatments / Results  Labs (all labs ordered are listed, but only abnormal results are displayed) Labs Reviewed  SARS CORONAVIRUS 2 (TAT 6-24 HRS)    EKG   Radiology DG Chest 2 View  Result Date: 01/21/2022 CLINICAL DATA:  Dyspnea. EXAM: CHEST - 2 VIEW COMPARISON:  Chest x-ray 07/31/2021. FINDINGS: The heart size and mediastinal contours are within normal limits.  Both lungs are clear. The visualized skeletal structures are unremarkable. IMPRESSION: No active cardiopulmonary disease. Electronically Signed   By: Ronney Asters M.D.   On: 01/21/2022 18:36    Procedures Procedures (including critical care time)  Medications Ordered in UC Medications - No data to display  Initial Impression / Assessment and Plan / UC Course  I have reviewed the triage vital signs and the nursing notes.  Pertinent labs & imaging results that were available during my care of the patient were reviewed by me and considered in my medical decision making (see chart for details).     Chest x-ray is clear.  Ibuprofen as needed for aches and pains.  Rest and fluids.  She will be swabbed for COVID.  She is not at high risk for severe COVID disease Final Clinical Impressions(s) / UC Diagnoses   Final diagnoses:  Viral syndrome     Discharge Instructions      Your chest x-ray was clear.  You have been swabbed for COVID, and the test will result in the next 24 hours. Our staff will call you if positive. If the test is positive, you should quarantine for 5 days.      ED Prescriptions     Medication Sig Dispense Auth. Provider   ibuprofen (ADVIL) 800 MG tablet Take 1 tablet (800 mg total) by mouth 3 (three) times daily. 21 tablet Ismerai Bin, Gwenlyn Perking, MD      PDMP not reviewed this encounter.   Barrett Henle, MD 01/21/22 Pollie Meyer    Barrett Henle, MD 01/21/22 [phone]

## 2022-01-21 NOTE — Discharge Instructions (Addendum)
Your chest x-ray was clear.  You have been swabbed for COVID, and the test will result in the next 24 hours. Our staff will call you if positive. If the test is positive, you should quarantine for 5 days.

## 2022-01-22 LAB — SARS CORONAVIRUS 2 (TAT 6-24 HRS): SARS Coronavirus 2: NEGATIVE

## 2023-03-10 ENCOUNTER — Ambulatory Visit (HOSPITAL_COMMUNITY)
Admission: EM | Admit: 2023-03-10 | Discharge: 2023-03-10 | Disposition: A | Discharge status: Home / Self Care | Payer: BC Managed Care – PPO | Attending: Family Medicine | Admitting: Family Medicine

## 2023-03-10 ENCOUNTER — Encounter (HOSPITAL_COMMUNITY): Payer: Self-pay | Admitting: *Deleted

## 2023-03-10 ENCOUNTER — Other Ambulatory Visit: Payer: Self-pay

## 2023-03-10 DIAGNOSIS — R030 Elevated blood-pressure reading, without diagnosis of hypertension: Secondary | ICD-10-CM

## 2023-03-10 DIAGNOSIS — Z202 Contact with and (suspected) exposure to infections with a predominantly sexual mode of transmission: Secondary | ICD-10-CM | POA: Diagnosis not present

## 2023-03-10 LAB — POCT URINALYSIS DIPSTICK, ED / UC
Bilirubin Urine: NEGATIVE
Glucose, UA: NEGATIVE mg/dL
Ketones, ur: NEGATIVE mg/dL
Leukocytes,Ua: NEGATIVE
Nitrite: NEGATIVE
Protein, ur: NEGATIVE mg/dL
Specific Gravity, Urine: 1.025 (ref 1.005–1.030)
Urobilinogen, UA: 0.2 mg/dL (ref 0.0–1.0)
pH: 5.5 (ref 5.0–8.0)

## 2023-03-10 LAB — POC URINE PREG, ED
Preg Test, Ur: NEGATIVE
Preg Test, Ur: NEGATIVE

## 2023-03-10 MED ORDER — METRONIDAZOLE 500 MG PO TABS
ORAL_TABLET | ORAL | Status: AC
  Filled 2023-03-10: qty 4

## 2023-03-10 MED ORDER — METRONIDAZOLE 500 MG PO TABS
2000.0000 mg | ORAL_TABLET | Freq: Once | ORAL | Status: AC
  Administered 2023-03-10: 2000 mg via ORAL

## 2023-03-10 NOTE — Discharge Instructions (Signed)
We have sent testing for sexually transmitted infections. We will notify you of any positive results once they are received. If required, we will prescribe any medications you might need.  Please refrain from all sexual activity for at least the next seven days.  

## 2023-03-10 NOTE — ED Triage Notes (Signed)
Pt reports receiving a call from partner who tested positive for STD and needs testing.

## 2023-03-11 LAB — CERVICOVAGINAL ANCILLARY ONLY
Bacterial Vaginitis (gardnerella): POSITIVE — AB
Candida Glabrata: NEGATIVE
Candida Vaginitis: NEGATIVE
Chlamydia: NEGATIVE
Comment: NEGATIVE
Comment: NEGATIVE
Comment: NEGATIVE
Comment: NEGATIVE
Comment: NEGATIVE
Comment: NORMAL
Neisseria Gonorrhea: NEGATIVE
Trichomonas: POSITIVE — AB

## 2023-03-12 NOTE — ED Provider Notes (Signed)
[place]   [id number] 03/10/23 Arrival Time: [id number]  ASSESSMENT & PLAN:  1. STD exposure   2. Elevated blood pressure reading without diagnosis of hypertension    Meds ordered this encounter  Medications   metroNIDAZOLE (FLAGYL) tablet 2,000 mg   To treat empirically for trich.    Discharge Instructions      We have sent testing for sexually transmitted infections. We will notify you of any positive results once they are received. If required, we will prescribe any medications you might need.  Please refrain from all sexual activity for at least the next seven days.     Without s/s of PID.  UPT NEGATIVE. U/A with trace Hg. Culture sent.  Will notify of any positive results. Instructed to refrain from sexual activity for at least seven days.  Reviewed expectations re: course of current medical issues. Questions answered. Outlined signs and symptoms indicating need for more acute intervention. Patient verbalized understanding. After Visit Summary given.   SUBJECTIVE:  Leah Lane is a 52 y.o. female who reports receiving a call from partner who tested positive for STD and needs testing. No symptoms. Afebrile. No abdominal or pelvic pain. Normal PO intake wihout n/v. No genital rashes or lesions.   Patient's last menstrual period was 12/16/2022.   OBJECTIVE:  Vitals:   03/10/23 1128  BP: (!) 164/90  Pulse: 79  Resp: 19  Temp: 98.9 F (37.2 C)  SpO2: 100%     General appearance: alert, cooperative, appears stated age and no distress Lungs: unlabored respirations; speaks full sentences without difficulty Back: no CVA tenderness; FROM at waist Abdomen: soft, non-tender GU: deferred Skin: warm and dry Psychological: alert and cooperative; normal mood and affect.    Labs Reviewed  POCT URINALYSIS DIPSTICK, ED / UC - Abnormal; Notable for the following components:      Result Value   Hgb urine dipstick TRACE (*)    All other components  within normal limits  CERVICOVAGINAL ANCILLARY ONLY - Abnormal; Notable for the following components:   Bacterial Vaginitis (gardnerella) Positive (*)    Trichomonas Positive (*)    All other components within normal limits  POC URINE PREG, ED  POC URINE PREG, ED    No Known Allergies  History reviewed. No pertinent past medical history. Family History  Problem Relation Age of Onset   Healthy Mother    Healthy Father    Social History   Socioeconomic History   Marital status: Married    Spouse name: Not on file   Number of children: Not on file   Years of education: Not on file   Highest education level: Not on file  Occupational History   Not on file  Tobacco Use   Smoking status: Every Day    Packs/day: .5    Types: Cigarettes   Smokeless tobacco: Never  Vaping Use   Vaping Use: Never used  Substance and Sexual Activity   Alcohol use: Yes   Drug use: Yes    Types: Marijuana   Sexual activity: Yes    Birth control/protection: None  Other Topics Concern   Not on file  Social History Narrative   Not on file   Social Determinants of Health   Financial Resource Strain: Not on file  Food Insecurity: Not on file  Transportation Needs: Not on file  Physical Activity: Not on file  Stress: Not on file  Social Connections: Not on file  Intimate Partner Violence: Not on file  Vanessa Kick, MD 03/12/23 [phone]

## 2023-07-07 ENCOUNTER — Ambulatory Visit (HOSPITAL_COMMUNITY)
Admission: EM | Admit: 2023-07-07 | Discharge: 2023-07-07 | Disposition: A | Discharge status: Home / Self Care | Payer: BC Managed Care – PPO | Attending: Emergency Medicine | Admitting: Emergency Medicine

## 2023-07-07 ENCOUNTER — Encounter (HOSPITAL_COMMUNITY): Payer: Self-pay

## 2023-07-07 DIAGNOSIS — J069 Acute upper respiratory infection, unspecified: Secondary | ICD-10-CM

## 2023-07-07 DIAGNOSIS — Z20822 Contact with and (suspected) exposure to covid-19: Secondary | ICD-10-CM | POA: Diagnosis not present

## 2023-07-07 MED ORDER — DEXAMETHASONE 6 MG PO TABS: 6.0000 mg | ORAL_TABLET | Freq: Two times a day (BID) | ORAL | 0 refills | Status: AC

## 2023-07-07 MED ORDER — PROMETHAZINE-DM 6.25-15 MG/5ML PO SYRP: 5.0000 mL | ORAL_SOLUTION | Freq: Four times a day (QID) | ORAL | 0 refills | Status: DC | PRN

## 2023-07-07 NOTE — Discharge Instructions (Signed)
You received a COVID-19 PCR test today.  The result of your COVID-19 test will be posted to your MyChart once it is complete, typically this takes 6 to 12 hours.      If your COVID-19 PCR test is negative, please consider retesting in the next 2 to 3 days, particularly if you are not feeling any better.  You are welcome to return here to urgent care to have it done or you can take a home COVID-19 test.    If both your COVID-19 tests are negative, then you can safely assume that your illness is due to one of the many less serious illnesses circulating in our community right now.    Based on my physical exam findings and the history you have provided  today, I do not recommend antibiotics at this time.  I do not believe the risks and side effects of antibiotics would outweigh any minimal benefit that they might provide.       Conservative care is recommended with rest, drinking plenty of clear fluids, eating only when hungry, taking supportive medications for your symptoms and avoiding being around other people.  Please remain at home until you are fever free for 24 hours without the use of antifever medications such as Tylenol and ibuprofen.   Please read below to learn more about the medications, dosages and frequencies that I recommend to help alleviate your symptoms and to get you feeling better soon:   Decadron (dexamethasone):  To quickly address your significant respiratory inflammation causing congestion and cough, please begin 1 tablet twice daily for 5 days.    Promethazine DM: Promethazine is both a nasal decongestant that dries up mucous membranes and an antinausea medication.  Promethazine often makes most patients feel fairly sleepy.  "DM" is dextromethorphan, a single symptom reliever which is a cough suppressant found in many over-the-counter cough medications and combination cold preparations.  Please take 5 mL before bedtime to minimize your cough which will help you sleep better.  You  are also welcome to take it during the daytime separating each dose by 8 hours.  I have sent a prescription for this medication to your pharmacy because it cannot be purchased over-the-counter.   If symptoms have not meaningfully improved in the next 5 to 7 days, please return for repeat evaluation or follow-up with your regular provider.  If symptoms have worsened in the next 3 to 5 days, please return for repeat evaluation or follow-up with your regular provider.    Thank you for visiting urgent care today.  We appreciate the opportunity to participate in your care.

## 2023-07-07 NOTE — ED Triage Notes (Signed)
Patient here today with c/o body aches, stuffy head, headache, cough, and runny nose X 2 days. Her friend called her and told her that she tested positive for Covid 4 days ago. They had just came back from [place]. She has been taking IBU with no relief.

## 2023-07-07 NOTE — ED Provider Notes (Signed)
[facility]    CSN: [id number] Arrival date & time: 07/07/23  1421    HISTORY   Chief Complaint  Patient presents with   Cough   HPI Leah Lane is a pleasant, 52 y.o. female who presents to urgent care today. Pt c/o body aches, nasal/sinus congestion, generalized headache, non-productive cough, and clear rhinorrhea X 2 days. States she just got back from [place] five days ago. States her fried, who went on the trip with her, tested positive for Covid-19 4 days ago. States has been taking IBU with no relief.   The history is provided by the patient.   History reviewed. No pertinent past medical history. There are no problems to display for this patient.  Past Surgical History:  Procedure Laterality Date   HERNIA REPAIR     OB History   No obstetric history on file.    Home Medications    Prior to Admission medications   Medication Sig Start Date End Date Taking? Authorizing Provider  ibuprofen (ADVIL) 800 MG tablet Take 1 tablet (800 mg total) by mouth 3 (three) times daily. 01/21/22   Zenia Resides, MD    Family History Family History  Problem Relation Age of Onset   Healthy Mother    Healthy Father    Social History Social History   Tobacco Use   Smoking status: Every Day    Current packs/day: 0.50    Types: Cigarettes   Smokeless tobacco: Never  Vaping Use   Vaping status: Never Used  Substance Use Topics   Alcohol use: Yes   Drug use: Yes    Types: Marijuana   Allergies   Patient has no known allergies.  Review of Systems Review of Systems Pertinent findings revealed after performing a 14 point review of systems has been noted in the history of present illness.  Physical Exam Vital Signs BP 125/84 (BP Location: Left Arm)   Pulse 81   Temp 98.3 F (36.8 C) (Oral)   Resp 20   LMP 05/21/2023 (Exact Date)   SpO2 98%   No data found.  Physical Exam Vitals and nursing note reviewed.  Constitutional:      General: She is not in  acute distress.    Appearance: Normal appearance.  HENT:     Head: Normocephalic and atraumatic.     Right Ear: Tympanic membrane, ear canal and external ear normal.     Left Ear: Tympanic membrane, ear canal and external ear normal.     Nose: Congestion and rhinorrhea present. Rhinorrhea is clear.  Eyes:     Pupils: Pupils are equal, round, and reactive to light.  Cardiovascular:     Rate and Rhythm: Normal rate and regular rhythm.  Pulmonary:     Effort: Pulmonary effort is normal. No tachypnea, bradypnea, accessory muscle usage or prolonged expiration.     Breath sounds: Examination of the right-upper field reveals decreased breath sounds. Examination of the left-upper field reveals decreased breath sounds. Examination of the right-middle field reveals decreased breath sounds. Examination of the left-middle field reveals decreased breath sounds. Examination of the right-lower field reveals decreased breath sounds. Examination of the left-lower field reveals decreased breath sounds. Decreased breath sounds present.  Musculoskeletal:        General: Normal range of motion.     Cervical back: Normal range of motion and neck supple. No tenderness.  Lymphadenopathy:     Cervical: No cervical adenopathy.  Skin:    General: Skin is  warm and dry.  Neurological:     General: No focal deficit present.     Mental Status: She is alert and oriented to person, place, and time. Mental status is at baseline.  Psychiatric:        Mood and Affect: Mood normal.        Behavior: Behavior normal.        Thought Content: Thought content normal.        Judgment: Judgment normal.     Visual Acuity Right Eye Distance:   Left Eye Distance:   Bilateral Distance:    Right Eye Near:   Left Eye Near:    Bilateral Near:     UC Couse / Diagnostics / Procedures:     Radiology No results found.  Procedures Procedures (including critical care time) EKG  Pending results:  Labs Reviewed  SARS  CORONAVIRUS 2 (TAT 6-24 HRS)    Medications Ordered in UC: Medications - No data to display  UC Diagnoses / Final Clinical Impressions(s)   I have reviewed the triage vital signs and the nursing notes.  Pertinent labs & imaging results that were available during my care of the patient were reviewed by me and considered in my medical decision making (see chart for details).    Final diagnoses:  Exposure to COVID-19 virus  Viral URI with cough   COVID-19 test result pending, will advise patient of results once received.  Patient provided with a prescription for Decadron twice daily for 5 days for treatment of COVID related symptoms including headache, significant nasal congestion, sinus pressure and decreased breath sounds.  Patient provided with Promethazine DM for nighttime cough so she can get some sleep.  Conservative care recommended.  Return precautions advised.  Please see discharge instructions below for details of plan of care as provided to patient. ED Prescriptions     Medication Sig Dispense Auth. Provider   dexamethasone (DECADRON) 6 MG tablet Take 1 tablet (6 mg total) by mouth 2 (two) times daily with a meal for 5 days. 10 tablet Theadora Rama Scales, PA-C   promethazine-dextromethorphan (PROMETHAZINE-DM) 6.25-15 MG/5ML syrup Take 5 mLs by mouth 4 (four) times daily as needed for cough. 118 mL Theadora Rama Scales, PA-C      PDMP not reviewed this encounter.  Pending results:  Labs Reviewed  SARS CORONAVIRUS 2 (TAT 6-24 HRS)    Discharge Instructions:   Discharge Instructions      You received a COVID-19 PCR test today.  The result of your COVID-19 test will be posted to your MyChart once it is complete, typically this takes 6 to 12 hours.      If your COVID-19 PCR test is negative, please consider retesting in the next 2 to 3 days, particularly if you are not feeling any better.  You are welcome to return here to urgent care to have it done or you can take a  home COVID-19 test.    If both your COVID-19 tests are negative, then you can safely assume that your illness is due to one of the many less serious illnesses circulating in our community right now.    Based on my physical exam findings and the history you have provided  today, I do not recommend antibiotics at this time.  I do not believe the risks and side effects of antibiotics would outweigh any minimal benefit that they might provide.       Conservative care is recommended with rest, drinking plenty of clear  fluids, eating only when hungry, taking supportive medications for your symptoms and avoiding being around other people.  Please remain at home until you are fever free for 24 hours without the use of antifever medications such as Tylenol and ibuprofen.   Please read below to learn more about the medications, dosages and frequencies that I recommend to help alleviate your symptoms and to get you feeling better soon:   Decadron (dexamethasone):  To quickly address your significant respiratory inflammation causing congestion and cough, please begin 1 tablet twice daily for 5 days.    Promethazine DM: Promethazine is both a nasal decongestant that dries up mucous membranes and an antinausea medication.  Promethazine often makes most patients feel fairly sleepy.  "DM" is dextromethorphan, a single symptom reliever which is a cough suppressant found in many over-the-counter cough medications and combination cold preparations.  Please take 5 mL before bedtime to minimize your cough which will help you sleep better.  You are also welcome to take it during the daytime separating each dose by 8 hours.  I have sent a prescription for this medication to your pharmacy because it cannot be purchased over-the-counter.   If symptoms have not meaningfully improved in the next 5 to 7 days, please return for repeat evaluation or follow-up with your regular provider.  If symptoms have worsened in the next 3 to 5  days, please return for repeat evaluation or follow-up with your regular provider.    Thank you for visiting urgent care today.  We appreciate the opportunity to participate in your care.       Disposition Upon Discharge:  Condition: stable for discharge home  Patient presented with an acute illness with associated systemic symptoms and significant discomfort requiring urgent management. In my opinion, this is a condition that a prudent lay person (someone who possesses an average knowledge of health and medicine) may potentially expect to result in complications if not addressed urgently such as respiratory distress, impairment of bodily function or dysfunction of bodily organs.   Routine symptom specific, illness specific and/or disease specific instructions were discussed with the patient and/or caregiver at length.   As such, the patient has been evaluated and assessed, work-up was performed and treatment was provided in alignment with urgent care protocols and evidence based medicine.  Patient/parent/caregiver has been advised that the patient may require follow up for further testing and treatment if the symptoms continue in spite of treatment, as clinically indicated and appropriate.  Patient/parent/caregiver has been advised to return to the [facility] or PCP if no better; to PCP or the Emergency Department if new signs and symptoms develop, or if the current signs or symptoms continue to change or worsen for further workup, evaluation and treatment as clinically indicated and appropriate  The patient will follow up with their current PCP if and as advised. If the patient does not currently have a PCP we will assist them in obtaining one.   The patient may need specialty follow up if the symptoms continue, in spite of conservative treatment and management, for further workup, evaluation, consultation and treatment as clinically indicated and appropriate.  Patient/parent/caregiver verbalized  understanding and agreement of plan as discussed.  All questions were addressed during visit.  Please see discharge instructions below for further details of plan.  This office note has been dictated using Teaching laboratory technician.  Unfortunately, this method of dictation can sometimes lead to typographical or grammatical errors.  I apologize for your inconvenience in advance  if this occurs.  Please do not hesitate to reach out to me if clarification is needed.      Theadora Rama Scales, [place] 07/07/23 1615

## 2024-02-27 ENCOUNTER — Ambulatory Visit (HOSPITAL_COMMUNITY)
Admission: EM | Admit: 2024-02-27 | Discharge: 2024-02-27 | Disposition: A | Discharge status: Home / Self Care | Attending: Family Medicine | Admitting: Family Medicine

## 2024-02-27 ENCOUNTER — Encounter (HOSPITAL_COMMUNITY): Payer: Self-pay

## 2024-02-27 DIAGNOSIS — M5412 Radiculopathy, cervical region: Secondary | ICD-10-CM | POA: Diagnosis not present

## 2024-02-27 DIAGNOSIS — M25511 Pain in right shoulder: Secondary | ICD-10-CM

## 2024-02-27 DIAGNOSIS — R079 Chest pain, unspecified: Secondary | ICD-10-CM | POA: Diagnosis not present

## 2024-02-27 DIAGNOSIS — M549 Dorsalgia, unspecified: Secondary | ICD-10-CM | POA: Diagnosis not present

## 2024-02-27 MED ORDER — PREDNISONE 50 MG PO TABS: 50.0000 mg | ORAL_TABLET | Freq: Every day | ORAL | 0 refills | Status: AC

## 2024-02-27 MED ORDER — KETOROLAC TROMETHAMINE 30 MG/ML IJ SOLN
INTRAMUSCULAR | Status: AC
  Filled 2024-02-27: qty 1

## 2024-02-27 MED ORDER — KETOROLAC TROMETHAMINE 30 MG/ML IJ SOLN
30.0000 mg | Freq: Once | INTRAMUSCULAR | Status: AC
  Administered 2024-02-27: 30 mg via INTRAMUSCULAR

## 2024-02-27 MED ORDER — CYCLOBENZAPRINE HCL 10 MG PO TABS: 10.0000 mg | ORAL_TABLET | Freq: Two times a day (BID) | ORAL | 0 refills | Status: AC | PRN

## 2024-02-27 NOTE — Discharge Instructions (Addendum)
 You were seen today for back, chest and neck pain with numbness in the hands/fingers.  This appears to be muscle spasming. We have given you a sling for comfort.  I have given you a shot of toradol for pain.  I have sent out a script for steroid and a muscle relaxer.  This may make you tired/sleepy so please take when home and not driving.  You may use a heating pad as well.  Please follow up with your primary care provider if not improving or go to the ER if worsening symptoms.

## 2024-02-27 NOTE — ED Triage Notes (Signed)
 Patient presenting with right shoulder pain and spasm onset yesterday. States the pain started randomly, no known injuries or strain. Started in the right breast moved up to the right shoulder and upper back. States having numbness in the right hand as well.   Prescriptions or OTC medications tried: Yes- heating pad and Aleve    with little relief

## 2024-02-27 NOTE — ED Provider Notes (Signed)
 [facility]    CSN: [id number] Arrival date & time: 02/27/24  0900      History   Chief Complaint Chief Complaint  Patient presents with   Shoulder Pain   Numbness    HPI Leah Lane is a 53 y.o. female.    Shoulder Pain Associated symptoms: back pain and neck pain    Patient is here for right shoulder pain, muscle spasms along with numbness into the hand since yesterday.  It started to the right chest, under the breast and has moved to include the shoulder and back.  No injury.  No heavy lifting, no twisting, etc.  Used aleve and heating pad.  She usually gets muscle spasms on the left, but not the right.        History reviewed. No pertinent past medical history.  There are no active problems to display for this patient.   Past Surgical History:  Procedure Laterality Date   HERNIA REPAIR      OB History   No obstetric history on file.      Home Medications    Prior to Admission medications   Medication Sig Start Date End Date Taking? Authorizing Provider  promethazine-dextromethorphan (PROMETHAZINE-DM) 6.25-15 MG/5ML syrup Take 5 mLs by mouth 4 (four) times daily as needed for cough. 07/07/23   Theadora Rama Scales, PA-C    Family History Family History  Problem Relation Age of Onset   Healthy Mother    Healthy Father     Social History Social History   Tobacco Use   Smoking status: Every Day    Current packs/day: 0.50    Types: Cigarettes   Smokeless tobacco: Never  Vaping Use   Vaping status: Never Used  Substance Use Topics   Alcohol use: Yes   Drug use: Yes    Types: Marijuana     Allergies   Patient has no known allergies.   Review of Systems Review of Systems  Constitutional: Negative.   HENT: Negative.    Respiratory: Negative.    Cardiovascular: Negative.   Gastrointestinal: Negative.   Musculoskeletal:  Positive for back pain and neck pain.     Physical Exam Triage Vital Signs ED Triage Vitals   Encounter Vitals Group     BP 02/27/24 0950 136/84     Systolic BP Percentile --      Diastolic BP Percentile --      Pulse Rate 02/27/24 0950 78     Resp 02/27/24 0950 18     Temp 02/27/24 0950 98 F (36.7 C)     Temp Source 02/27/24 0950 Oral     SpO2 02/27/24 0950 98 %     Weight --      Height --      Head Circumference --      Peak Flow --      Pain Score 02/27/24 0948 10     Pain Loc --      Pain Education --      Exclude from Growth Chart --    No data found.  Updated Vital Signs BP 136/84 (BP Location: Left Arm)   Pulse 78   Temp 98 F (36.7 C) (Oral)   Resp 18   SpO2 98%   Visual Acuity Right Eye Distance:   Left Eye Distance:   Bilateral Distance:    Right Eye Near:   Left Eye Near:    Bilateral Near:     Physical Exam Constitutional:  Appearance: Normal appearance. She is normal weight.     Comments: In tears due to pain  Cardiovascular:     Rate and Rhythm: Normal rate and regular rhythm.  Pulmonary:     Effort: Pulmonary effort is normal.     Breath sounds: Normal breath sounds.  Musculoskeletal:     Comments: +TTP to the cervical spine, right trapezius, right shoulder, and right chest to light palpation;  Decreased ROM to the neck due to pain.  Decreased ROM to the right shoulder due to pain.  Normal handgrip strength bilaterally  Skin:    General: Skin is warm.  Neurological:     General: No focal deficit present.     Mental Status: She is alert.     Comments: Decreased sensation to touch to the right arm/hand  Psychiatric:        Mood and Affect: Mood normal.      UC Treatments / Results  Labs (all labs ordered are listed, but only abnormal results are displayed) Labs Reviewed - No data to display  EKG   Radiology No results found.  Procedures Procedures (including critical care time)  Medications Ordered in UC Medications  ketorolac (TORADOL) 30 MG/ML injection 30 mg (has no administration in time range)     Initial Impression / Assessment and Plan / UC Course  I have reviewed the triage vital signs and the nursing notes.  Pertinent labs & imaging results that were available during my care of the patient were reviewed by me and considered in my medical decision making (see chart for details).   Final Clinical Impressions(s) / UC Diagnoses   Final diagnoses:  Cervical radiculopathy  Acute pain of right shoulder  Upper back pain on right side  Right-sided chest pain     Discharge Instructions      You were seen today for back, chest and neck pain with numbness in the hands/fingers.  This appears to be muscle spasming. We have given you a sling for comfort.  I have given you a shot of toradol for pain.  I have sent out a script for steroid and a muscle relaxer.  This may make you tired/sleepy so please take when home and not driving.  You may use a heating pad as well.  Please follow up with your primary care provider if not improving or go to the ER if worsening symptoms.     ED Prescriptions     Medication Sig Dispense Auth. Provider   cyclobenzaprine (FLEXERIL) 10 MG tablet Take 1 tablet (10 mg total) by mouth 2 (two) times daily as needed for muscle spasms. 20 tablet Huston Stonehocker, MD   predniSONE (DELTASONE) 50 MG tablet Take 1 tablet (50 mg total) by mouth daily for 5 days. 5 tablet Jannifer Franklin, MD      PDMP not reviewed this encounter.   Jannifer Franklin, MD 02/27/24 1021

## 2024-11-24 ENCOUNTER — Ambulatory Visit (HOSPITAL_COMMUNITY): Admission: EM | Admit: 2024-11-24 | Discharge: 2024-11-24 | Disposition: A | Discharge status: Home / Self Care

## 2024-11-24 ENCOUNTER — Encounter (HOSPITAL_COMMUNITY): Payer: Self-pay

## 2024-11-24 DIAGNOSIS — K047 Periapical abscess without sinus: Secondary | ICD-10-CM | POA: Diagnosis not present

## 2024-11-24 MED ORDER — AMOXICILLIN-POT CLAVULANATE 875-125 MG PO TABS: 1.0000 | ORAL_TABLET | Freq: Two times a day (BID) | ORAL | 0 refills | Status: AC

## 2024-11-24 MED ORDER — DICLOFENAC SODIUM 50 MG PO TBEC: 50.0000 mg | DELAYED_RELEASE_TABLET | Freq: Two times a day (BID) | ORAL | 1 refills | Status: AC

## 2024-11-24 NOTE — ED Provider Notes (Signed)
 [facility] [place]  Note:  This document was prepared using Conservation officer, historic buildings and may include unintentional dictation errors.  MRN: [id number] DOB: 18-Oct-1971  Subjective:   Leah Lane is a 53 y.o. female presenting for right sided lower jaw facial swelling and pain x 2 days.  Patient denies any known dental issues or infections.  Patient reports she has been using Tylenol  with minimal improvement to her symptoms.  Patient states that she has not seen a dentist in quite some time.  Patient does not currently have a dentist to follow-up with.  No current facility-administered medications for this encounter.  Current Outpatient Medications:    amoxicillin-clavulanate (AUGMENTIN) 875-125 MG tablet, Take 1 tablet by mouth every 12 (twelve) hours for 10 days., Disp: 20 tablet, Rfl: 0   diclofenac (VOLTAREN) 50 MG EC tablet, Take 1 tablet (50 mg total) by mouth 2 (two) times daily., Disp: 30 tablet, Rfl: 1   cyclobenzaprine  (FLEXERIL ) 10 MG tablet, Take 1 tablet (10 mg total) by mouth 2 (two) times daily as needed for muscle spasms., Disp: 20 tablet, Rfl: 0   No Known Allergies  History reviewed. No pertinent past medical history.   Past Surgical History:  Procedure Laterality Date   HERNIA REPAIR      Family History  Problem Relation Age of Onset   Healthy Mother    Healthy Father     Social History   Tobacco Use   Smoking status: Every Day    Current packs/day: 0.50    Types: Cigarettes   Smokeless tobacco: Never  Vaping Use   Vaping status: Never Used  Substance Use Topics   Alcohol use: Yes   Drug use: Yes    Types: Marijuana    ROS Refer to HPI for ROS details.  Objective:    Vitals: BP 133/86 (BP Location: Right Arm)   Pulse 73   Temp 98.6 F (37 C) (Oral)   Resp 16   SpO2 96%   Physical Exam Vitals and nursing note reviewed.  Constitutional:      General: She is not in acute distress.    Appearance: She is well-developed. She  is not ill-appearing or toxic-appearing.  HENT:     Head: Normocephalic and atraumatic.     Mouth/Throat:     Lips: Pink.     Mouth: Mucous membranes are moist.     Dentition: Abnormal dentition. Dental tenderness, gingival swelling, dental caries and dental abscesses present.  Cardiovascular:     Rate and Rhythm: Normal rate.  Pulmonary:     Effort: Pulmonary effort is normal. No respiratory distress.  Musculoskeletal:        General: Normal range of motion.  Skin:    General: Skin is warm and dry.  Neurological:     General: No focal deficit present.     Mental Status: She is alert and oriented to person, place, and time.  Psychiatric:        Mood and Affect: Mood normal.        Behavior: Behavior normal.     Procedures  No results found for this or any previous visit (from the past 24 hours).  Assessment and Plan :     Discharge Instructions       1. Dental abscess (Primary) - diclofenac (VOLTAREN) 50 MG EC tablet; Take 1 tablet (50 mg total) by mouth 2 (two) times daily.  Dispense: 30 tablet; Refill: 1 - amoxicillin-clavulanate (AUGMENTIN) 875-125 MG tablet; Take 1 tablet  by mouth every 12 (twelve) hours for 10 days.  Dispense: 20 tablet; Refill: 0 - Follow-up with dentist for further evaluation and treatment of right lower jaw dental infection.  -Continue to monitor symptoms for any change in severity if there is any escalation of current symptoms or development of new symptoms follow-up in ER for further evaluation and management.     Lisvet Rasheed B Aleena Kirkeby   Alexxus Sobh B, NP 11/24/24 2000

## 2024-11-24 NOTE — Discharge Instructions (Addendum)
°  1. Dental abscess (Primary) - diclofenac (VOLTAREN) 50 MG EC tablet; Take 1 tablet (50 mg total) by mouth 2 (two) times daily.  Dispense: 30 tablet; Refill: 1 - amoxicillin-clavulanate (AUGMENTIN) 875-125 MG tablet; Take 1 tablet by mouth every 12 (twelve) hours for 10 days.  Dispense: 20 tablet; Refill: 0 - Follow-up with dentist for further evaluation and treatment of right lower jaw dental infection.  -Continue to monitor symptoms for any change in severity if there is any escalation of current symptoms or development of new symptoms follow-up in ER for further evaluation and management.

## 2024-11-24 NOTE — ED Triage Notes (Signed)
 Patient reports that she began having right lower facial swelling and pain.  Patient denies any dental issues at this time.  Patient states she has taken Tylenol  for her pain
